# Patient Record
Sex: Female | Born: 1960 | Race: White | Hispanic: No | Marital: Married | Smoking: Never smoker
Health system: Southern US, Community
[De-identification: ages and names within clinical notes are randomized; demographics above are authoritative.]

## PROBLEM LIST (undated history)

## (undated) DIAGNOSIS — D4959 Neoplasm of unspecified behavior of other genitourinary organ: Secondary | ICD-10-CM

## (undated) DIAGNOSIS — Z862 Personal history of diseases of the blood and blood-forming organs and certain disorders involving the immune mechanism: Secondary | ICD-10-CM

## (undated) DIAGNOSIS — Z973 Presence of spectacles and contact lenses: Secondary | ICD-10-CM

## (undated) HISTORY — PX: LAPAROSCOPIC CHOLECYSTECTOMY: SUR755

---

## 1999-01-22 ENCOUNTER — Other Ambulatory Visit: Admission: RE | Admit: 1999-01-22 | Discharge: 1999-01-22 | Payer: Self-pay | Admitting: *Deleted

## 1999-06-13 ENCOUNTER — Ambulatory Visit (HOSPITAL_COMMUNITY): Admission: RE | Admit: 1999-06-13 | Discharge: 1999-06-13 | Payer: Self-pay | Admitting: *Deleted

## 1999-06-13 ENCOUNTER — Encounter: Payer: Self-pay | Admitting: *Deleted

## 2000-01-28 ENCOUNTER — Other Ambulatory Visit: Admission: RE | Admit: 2000-01-28 | Discharge: 2000-01-28 | Payer: Self-pay | Admitting: *Deleted

## 2001-03-18 ENCOUNTER — Other Ambulatory Visit: Admission: RE | Admit: 2001-03-18 | Discharge: 2001-03-18 | Payer: Self-pay | Admitting: *Deleted

## 2001-06-22 ENCOUNTER — Other Ambulatory Visit: Admission: RE | Admit: 2001-06-22 | Discharge: 2001-06-22 | Payer: Self-pay | Admitting: *Deleted

## 2001-09-09 ENCOUNTER — Observation Stay (HOSPITAL_COMMUNITY): Admission: RE | Admit: 2001-09-09 | Discharge: 2001-09-10 | Payer: Self-pay | Admitting: General Surgery

## 2001-09-09 ENCOUNTER — Encounter (INDEPENDENT_AMBULATORY_CARE_PROVIDER_SITE_OTHER): Payer: Self-pay | Admitting: *Deleted

## 2002-02-27 ENCOUNTER — Other Ambulatory Visit: Admission: RE | Admit: 2002-02-27 | Discharge: 2002-02-27 | Payer: Self-pay | Admitting: *Deleted

## 2003-03-01 ENCOUNTER — Other Ambulatory Visit: Admission: RE | Admit: 2003-03-01 | Discharge: 2003-03-01 | Payer: Self-pay | Admitting: *Deleted

## 2004-03-07 ENCOUNTER — Other Ambulatory Visit: Admission: RE | Admit: 2004-03-07 | Discharge: 2004-03-07 | Payer: Self-pay | Admitting: *Deleted

## 2004-03-11 ENCOUNTER — Ambulatory Visit (HOSPITAL_COMMUNITY): Admission: RE | Admit: 2004-03-11 | Discharge: 2004-03-11 | Payer: Self-pay | Admitting: *Deleted

## 2005-03-16 ENCOUNTER — Other Ambulatory Visit: Admission: RE | Admit: 2005-03-16 | Discharge: 2005-03-16 | Payer: Self-pay | Admitting: *Deleted

## 2006-03-19 ENCOUNTER — Other Ambulatory Visit: Admission: RE | Admit: 2006-03-19 | Discharge: 2006-03-19 | Payer: Self-pay | Admitting: Obstetrics & Gynecology

## 2007-04-12 ENCOUNTER — Other Ambulatory Visit: Admission: RE | Admit: 2007-04-12 | Discharge: 2007-04-12 | Payer: Self-pay | Admitting: Obstetrics & Gynecology

## 2008-04-12 ENCOUNTER — Other Ambulatory Visit: Admission: RE | Admit: 2008-04-12 | Discharge: 2008-04-12 | Payer: Self-pay | Admitting: Obstetrics and Gynecology

## 2011-01-02 NOTE — Op Note (Signed)
[facility]  Patient:    Nancy Carter, Nancy Carter Visit Number: [id number] MRN: [id number]          Service Type: SUR Location: [id number] Attending Physician:  Brandy Hale Dictated by:   Angelia Mould. Derrell Lolling, M.D. Proc. Date: 09/09/01 Admit Date:  09/09/2001 Discharge Date: 09/10/2001   CC:         Marinus Maw, M.D.   Operative Report  PREOPERATIVE DIAGNOSIS:  Chronic cholecystitis with cholelithiasis.  POSTOPERATIVE DIAGNOSIS:  Chronic cholecystitis with cholelithiasis.  OPERATION PERFORMED:  Laparoscopic cholecystectomy.  SURGEON:  Angelia Mould. Derrell Lolling, M.D.  FIRST ASSISTANT:  Maisie Fus B. Samuella Cota, M.D.  OPERATIVE INDICATION:  This is a 50 year old white female, who recently developed a couple episodes of severe biliary colic with right upper quadrant pain, right shoulder pain, and nausea.  Gallbladder ultrasound shows a gallbladder full of gallstones and thick-walled gallbladder.  Common bile duct is normal caliber.  Liver function tests are normal.  She is brought to the operating room electively.  OPERATIVE FINDINGS:  The gallbladder was chronically inflamed, thick-walled, and was packed with gallstones.  The anatomy of the cystic duct, cystic artery, and common bile duct were conventional.  The cystic duct was very tiny in caliber.  There was a large stone impacted at the neck of the gallbladder. The liver appeared healthy.  The stomach and duodenum, large intestine and small intestine were grossly normal to inspection.  OPERATIVE TECHNIQUE:  Following the induction of general endotracheal anesthesia, the patients abdomen was prepped and draped in a sterile fashion. Then 0.5% Marcaine with epinephrine was used as a local infiltration anesthetic.  A vertically-oriented incision was made inside the lower rim of the umbilicus.  The fascia was incised in the midline and the abdominal cavity entered under direct vision.  The 10 mm Hasson  trocar was inserted and secured with a pursestring suture of 0 Vicryl.  Pneumoperitoneum was created.  Video camera was inserted with visualization and findings as described above.  A 10 mm trocar was placed in the subxiphoid region and two 5 mm trocars placed in the right mid abdomen.  The gallbladder was elevated, placed on traction. Adhesions were taken down.  We incised the peritoneum of the lower gallbladder and carefully dissected out the cystic duct and cystic artery.  We isolated the anterior branch of the cystic artery, secured it with metal clips and divided it as we went on to the gallbladder.  We ultimately isolated the posterior branch of the cystic artery and clipped as well.  We thus created a large window behind the cystic duct, clearly identifying the junction of the cystic duct with the infundibulum of the gallbladder and identifying the region of the common bile duct.  The very tiny cystic duct was then secured with multiple metal clips and divided.  The gallbladder was dissected from its bed with electrocautery and removed through the umbilical port.  The operative field was inspected and irrigated.  There was no bleeding and no bile leak whatsoever.  The trocars were removed under direct vision, and there was no bleeding from the trocar sites.  The pneumoperitoneum was released.  The fascia at the umbilicus was closed with 0 Vicryl sutures.  The skin incisions were closed with subcuticular sutures of 4-0 Vicryl and Steri-Strips.  Clean bandages were placed and the patient taken to the recovery room in stable condition.  Estimated blood loss was about 10 cc.  Complications none. Sponge, needle, and instrument counts  were correct. Dictated by:   Angelia Mould. Derrell Lolling, M.D. Attending Physician:  Brandy Hale DD:  09/09/01 TD:  09/10/01 Job: [id number]

## 2011-04-14 ENCOUNTER — Other Ambulatory Visit: Payer: Self-pay | Admitting: Certified Nurse Midwife

## 2011-04-14 DIAGNOSIS — Z1231 Encounter for screening mammogram for malignant neoplasm of breast: Secondary | ICD-10-CM

## 2011-04-28 ENCOUNTER — Ambulatory Visit (HOSPITAL_COMMUNITY)
Admission: RE | Admit: 2011-04-28 | Discharge: 2011-04-28 | Disposition: A | Discharge status: Home / Self Care | Payer: Commercial Managed Care - PPO | Source: Ambulatory Visit | Attending: Certified Nurse Midwife | Admitting: Certified Nurse Midwife

## 2011-04-28 DIAGNOSIS — Z1231 Encounter for screening mammogram for malignant neoplasm of breast: Secondary | ICD-10-CM

## 2012-12-22 ENCOUNTER — Telehealth: Payer: Self-pay | Admitting: Certified Nurse Midwife

## 2012-12-22 NOTE — Telephone Encounter (Signed)
Needs OV to evaluate

## 2012-12-22 NOTE — Telephone Encounter (Signed)
Spoke with pt about periods. Pt was told by DL that she was peri-menopausal at last visit. Periods have been irregular for a year, but have been coming every 15-21 days since January. In March, she had a period from the 11th to the 15th, bleeding stopped for 2 days, then started back from the 18th for a week. Pt had period on April 15th that was normal. Then in May she started on the 1st, bled 2 days, stopped, then started again 2 days later. Is this to be expected, or should she be concerned?

## 2012-12-22 NOTE — Telephone Encounter (Signed)
LMTCB, recommend appointment and can see her tomm if she wants.

## 2012-12-22 NOTE — Telephone Encounter (Signed)
LEFT MESSAGE ON CB# TO RETURN CALL  FOR IRREGULAR MENSES.

## 2012-12-22 NOTE — Telephone Encounter (Signed)
Irregular menses

## 2012-12-23 NOTE — Telephone Encounter (Signed)
PATIENT NOTIFIED ON NEED TO BE SEEN BY D.LEONARD, CNM, PATIENT REQUEST TO COME IN ON MAY 13TH # 10;30am. APPT. MADE.

## 2012-12-26 ENCOUNTER — Encounter: Payer: Self-pay | Admitting: *Deleted

## 2012-12-27 ENCOUNTER — Ambulatory Visit (INDEPENDENT_AMBULATORY_CARE_PROVIDER_SITE_OTHER): Payer: Commercial Managed Care - PPO | Admitting: Certified Nurse Midwife

## 2012-12-27 ENCOUNTER — Encounter: Payer: Self-pay | Admitting: Certified Nurse Midwife

## 2012-12-27 VITALS — BP 110/78 | HR 68 | Resp 16

## 2012-12-27 DIAGNOSIS — N949 Unspecified condition associated with female genital organs and menstrual cycle: Secondary | ICD-10-CM

## 2012-12-27 DIAGNOSIS — N926 Irregular menstruation, unspecified: Secondary | ICD-10-CM

## 2012-12-27 DIAGNOSIS — N760 Acute vaginitis: Secondary | ICD-10-CM

## 2012-12-27 DIAGNOSIS — N938 Other specified abnormal uterine and vaginal bleeding: Secondary | ICD-10-CM

## 2012-12-27 MED ORDER — METRONIDAZOLE 500 MG PO TABS: 500.0000 mg | ORAL_TABLET | Freq: Two times a day (BID) | ORAL | Status: DC

## 2012-12-27 NOTE — Progress Notes (Signed)
52 yo married white female [employer] here with concern of abnormal period or bleeding.  Uses Micronor for contraception and aware of bleeding profile.  Describes periods previously as monthly with occasional spotting and period like bleeding regularly.  Has now had episodes of amenorrhea and then spotting continuously, then none.  Refer to menses calendar. No complaints of abdominal or pelvic pain.  Uses Micronor due to hypertension with combination OCP. No change in vaginal discharge that she is aware of. Concerned. Patient had visit with PCP recently with all labs done, including TSH, all normal per patient/(RN)  O: 04-28-12 normal with negative pap with HPVHR not detected POCT Hgb. 13.2 Healthy female, WD,WN Affect: Normal, orientation X 3 Skin: warm and dry Pelvic Exam: External genital area normal no lesions BUS: negative Vagina: Dark brown discharge noted, wet prep taken Cervix: Non tender, normal Uterus: Firm, slight nodular feel posterior, non tender: Adnexa: non tender, no masses Perineal: no lesions  Wet Prep: clue cells positive  A:Essentially normal pelvic exam Uterus nodular feel ? Fibroid Wet prep BV Contraception Micronor, Hypertension with OCP  P: Reviewed findings and discussed fibroid? Benign finding, but can change bleeding profile, along with perimenopausal changes. Recommend PUS if Dr Frederik Schmidt advise patient Reviewed findings: Rx Flagyl see order Discussed normal bleeding profile with Micronor, will continue to keep menses record.  Questions addressed.  Rv as above, prn Reviewed, TL

## 2013-01-02 ENCOUNTER — Other Ambulatory Visit: Payer: Self-pay | Admitting: Emergency Medicine

## 2013-01-03 ENCOUNTER — Telehealth: Payer: Self-pay | Admitting: Certified Nurse Midwife

## 2013-01-03 NOTE — Telephone Encounter (Signed)
LMTCB to discuss insurance benefits and schedule PUS.  °

## 2013-01-18 ENCOUNTER — Encounter: Payer: Self-pay | Admitting: Certified Nurse Midwife

## 2013-02-08 ENCOUNTER — Telehealth: Payer: Self-pay | Admitting: Gynecology

## 2013-02-08 NOTE — Telephone Encounter (Signed)
LMTCB on cell phone to reschedule pus scheduled for 6/30 with [place] since Farrel Gobble will not be here.

## 2013-02-09 ENCOUNTER — Other Ambulatory Visit: Payer: Self-pay | Admitting: Obstetrics & Gynecology

## 2013-02-09 DIAGNOSIS — N939 Abnormal uterine and vaginal bleeding, unspecified: Secondary | ICD-10-CM

## 2013-02-09 DIAGNOSIS — N852 Hypertrophy of uterus: Secondary | ICD-10-CM

## 2013-02-13 ENCOUNTER — Ambulatory Visit (INDEPENDENT_AMBULATORY_CARE_PROVIDER_SITE_OTHER): Payer: Commercial Managed Care - PPO

## 2013-02-13 ENCOUNTER — Encounter: Payer: Self-pay | Admitting: Obstetrics & Gynecology

## 2013-02-13 ENCOUNTER — Ambulatory Visit (INDEPENDENT_AMBULATORY_CARE_PROVIDER_SITE_OTHER): Payer: Commercial Managed Care - PPO | Admitting: Obstetrics & Gynecology

## 2013-02-13 ENCOUNTER — Other Ambulatory Visit: Payer: Self-pay | Admitting: Obstetrics & Gynecology

## 2013-02-13 DIAGNOSIS — N939 Abnormal uterine and vaginal bleeding, unspecified: Secondary | ICD-10-CM

## 2013-02-13 DIAGNOSIS — N840 Polyp of corpus uteri: Secondary | ICD-10-CM

## 2013-02-13 DIAGNOSIS — N926 Irregular menstruation, unspecified: Secondary | ICD-10-CM

## 2013-02-13 DIAGNOSIS — N838 Other noninflammatory disorders of ovary, fallopian tube and broad ligament: Secondary | ICD-10-CM

## 2013-02-13 DIAGNOSIS — D259 Leiomyoma of uterus, unspecified: Secondary | ICD-10-CM

## 2013-02-13 DIAGNOSIS — N839 Noninflammatory disorder of ovary, fallopian tube and broad ligament, unspecified: Secondary | ICD-10-CM

## 2013-02-13 DIAGNOSIS — D251 Intramural leiomyoma of uterus: Secondary | ICD-10-CM

## 2013-02-13 DIAGNOSIS — N852 Hypertrophy of uterus: Secondary | ICD-10-CM

## 2013-02-13 DIAGNOSIS — N831 Corpus luteum cyst of ovary, unspecified side: Secondary | ICD-10-CM

## 2013-02-13 DIAGNOSIS — D4959 Neoplasm of unspecified behavior of other genitourinary organ: Secondary | ICD-10-CM

## 2013-02-13 DIAGNOSIS — N8300 Follicular cyst of ovary, unspecified side: Secondary | ICD-10-CM

## 2013-02-13 NOTE — Patient Instructions (Signed)
We will call with lab results later this week.

## 2013-02-13 NOTE — Progress Notes (Signed)
51 y.o.G0 MWF here for a pelvic ultrasound with sonohystogram. H/O enlarged uterus on exam.  During ultrasound, thickened endometrium noted.  Patient has been experiencing intermenstrual bleeding.    Indication: Enlarged uterus, intermenstrual bleeding Contraception:  OCP  Technique: Both transabdominal and transvaginal ultrasound examinations of the pelvis were performed. Transabdominal technique was performed for global imaging of the pelvis including uterus, ovaries, adnexal regions, and pelvic cul-de-sac. It was necessary to proceed with endovaginal exam following the abdominal ultrasound  transabdominal exam to visualize the endometrium and adnexa. Color and duplex Doppler ultrasound was utilized to evaluate blood flow to the ovaries.   FINDINGS: (report below)  UTERUS: 8.2 x 6.6 x 5.5cm with 3.3 and 1.2cm intramural fibroids EMS: 5.69mm  ADNEXA: left 3.9 x 2.7 x 1.7cm with 17mm simple appearing cyst Right 4.5 x 3.2 x 2.2cm with 20mm reticular appearing/avascular area, solid 22mm area at inferior portion of ovary and 23mm smooth ovarian cyst between these two findings CUL DE SAC: neg   SHSG: After obtaining appropriate verbal consent from patient, the cervix was visualized using a speculum, and prepped with betadine. A tenaculum was applied to the cervix. Dilation of the cervix was not necessary. The catheter was passed into the uterus and sterile saline introduced, with the following findings: 26 x 5mm probable polyp   Findings discussed with patient. Recommend removal of polyp with probable follow-up of ultrasound to ensure ovary looks like.  I feel can wait to proceed with hysteroscopic polyp resection and recheck ovary in about six weeks.  If findings are all functional, then changes will be seen by that time.  If not change, may decide to proceed with RSO.    Hysteroscopic polyp resection discussed with patient. Recovery and pain management discussed. Risks discussed including but not  limited to bleeding, rare risk of transfusion, infection, 1% risk of uterine perforation with risks of fluid deficit causing cardiac arrythmia, cerebral swelling and/or need to stop procedure early. Fluid emboli and rare risk of death discussed. DVT/PE, rare risk of risk of bowel/bladder/ureteral/vascular injury. Patient aware if pathology abnormal she may need additional treatment. All questions answered.   Assessment: AUB with mid cycle spotting, endometrial polyp, uterine fibroids, ovarian mass  Plan: Ca-125 today. If normal, plan repeat PUS in 6 weeks to recheck ovary.  If no change or larger, will remove ovary.  If smaller, will proceed with polyp removal only.   ~30 minutes spent with patient >50% of time was in face to face discussion of above.

## 2013-02-28 NOTE — Telephone Encounter (Signed)
Pt is not sure if Dr Hyacinth Meeker want to have a repeat ultrasound. Would like nurse to check her chart and call her.

## 2013-03-01 ENCOUNTER — Telehealth: Payer: Self-pay | Admitting: *Deleted

## 2013-03-01 ENCOUNTER — Encounter: Payer: Self-pay | Admitting: Certified Nurse Midwife

## 2013-03-01 DIAGNOSIS — N838 Other noninflammatory disorders of ovary, fallopian tube and broad ligament: Secondary | ICD-10-CM

## 2013-03-01 NOTE — Telephone Encounter (Signed)
See next message with result note.

## 2013-03-01 NOTE — Progress Notes (Signed)
See telephone note form 03-01-13. Patient is aware of result.

## 2013-03-01 NOTE — Telephone Encounter (Signed)
Message copied by Alisa Graff on Wed Mar 01, 2013 12:12 PM ------      Message from: Jerene Bears      Created: Wed Mar 01, 2013 10:09 AM       Result i just sent to you should go to [employer]. ------

## 2013-03-01 NOTE — Telephone Encounter (Signed)
Patient states she has received message about normal CA125. Advised dr Hyacinth Meeker recommends we proceed with hysteroscopic polyp resection.  Will also need PUS in 6 months to recheck ovary.  Patient agreeable to all.  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  Agrees to first available.  Will schedule and call her back.

## 2013-03-13 ENCOUNTER — Telehealth: Payer: Self-pay | Admitting: Obstetrics & Gynecology

## 2013-03-13 NOTE — Telephone Encounter (Signed)
Spoke with patient about her insurance benefits or surgery. Patient agreed to the amount and wants to proceed with scheduling. Patient also aware that she has a balance in Epic.

## 2013-03-28 ENCOUNTER — Telehealth: Payer: Self-pay | Admitting: *Deleted

## 2013-03-28 NOTE — Telephone Encounter (Signed)
Call to patient to advise of surgery scheduled for 04-10-13 at 1000 at [facility]. Surgery instructions given and copy mailed.  Post op appointment scheduled. Call prn.

## 2013-03-29 ENCOUNTER — Encounter (HOSPITAL_COMMUNITY): Payer: Self-pay | Admitting: *Deleted

## 2013-03-29 ENCOUNTER — Telehealth: Payer: Self-pay | Admitting: *Deleted

## 2013-03-29 NOTE — Telephone Encounter (Signed)
Patient came in to office to pay surgerical pre-payment.  Dropped off FMLA forms for completion.  Patient is an Charity fundraiser for [employer].  Usually do not complete FMLA forms for 3 days or less.  How much time is she out of work.  Patient did not mention this to me previously.

## 2013-03-30 NOTE — Telephone Encounter (Signed)
Happy to do FMLA papers for her but usually not necessary for only being out of work for 2-3 days, unless she has no PAL and then it would need to be done.  If you'll bring it to me, I'll do it.  Thanks.

## 2013-04-03 NOTE — Telephone Encounter (Signed)
Patient states she needs FMLA foorms even though just three days off to keep it from "counting against her" since this was not pre-approved PAL.  Requests to pick up forms when completed.

## 2013-04-04 NOTE — Telephone Encounter (Signed)
Forms completed and sent to Dr Hyacinth Meeker for review/signature.

## 2013-04-05 ENCOUNTER — Encounter (HOSPITAL_COMMUNITY): Payer: Self-pay | Admitting: Pharmacy Technician

## 2013-04-07 ENCOUNTER — Telehealth: Payer: Self-pay | Admitting: *Deleted

## 2013-04-07 NOTE — Telephone Encounter (Signed)
Completed FMLA forms received form Dr Hyacinth Meeker.  Call to patient to notify, LMTCB.

## 2013-04-07 NOTE — Telephone Encounter (Signed)
Patient returned call and she is notified that forms are ready.  She will have husband pick up this pm.  Scheduled 6 month follow up PUS for 10-05-13 at 2pm and the consult Dr Hyacinth Meeker at 230pm

## 2013-04-09 MED ORDER — DEXTROSE 5 % IV SOLN
2.0000 g | INTRAVENOUS | Status: AC
  Administered 2013-04-10: 2 g via INTRAVENOUS
  Filled 2013-04-09: qty 2

## 2013-04-10 ENCOUNTER — Encounter (HOSPITAL_COMMUNITY)
Admission: RE | Disposition: A | Discharge status: Home / Self Care | Payer: Self-pay | Source: Ambulatory Visit | Attending: Obstetrics & Gynecology

## 2013-04-10 ENCOUNTER — Ambulatory Visit (HOSPITAL_COMMUNITY)
Admission: RE | Admit: 2013-04-10 | Discharge: 2013-04-10 | Disposition: A | Discharge status: Home / Self Care | Payer: 59 | Source: Ambulatory Visit | Attending: Obstetrics & Gynecology | Admitting: Obstetrics & Gynecology

## 2013-04-10 ENCOUNTER — Ambulatory Visit (HOSPITAL_COMMUNITY): Payer: 59 | Admitting: Anesthesiology

## 2013-04-10 ENCOUNTER — Encounter (HOSPITAL_COMMUNITY): Payer: Self-pay | Admitting: Anesthesiology

## 2013-04-10 ENCOUNTER — Encounter (HOSPITAL_COMMUNITY): Payer: Self-pay | Admitting: *Deleted

## 2013-04-10 DIAGNOSIS — N949 Unspecified condition associated with female genital organs and menstrual cycle: Secondary | ICD-10-CM | POA: Insufficient documentation

## 2013-04-10 DIAGNOSIS — D250 Submucous leiomyoma of uterus: Secondary | ICD-10-CM

## 2013-04-10 DIAGNOSIS — D251 Intramural leiomyoma of uterus: Secondary | ICD-10-CM | POA: Insufficient documentation

## 2013-04-10 DIAGNOSIS — N938 Other specified abnormal uterine and vaginal bleeding: Secondary | ICD-10-CM | POA: Insufficient documentation

## 2013-04-10 DIAGNOSIS — N926 Irregular menstruation, unspecified: Secondary | ICD-10-CM | POA: Insufficient documentation

## 2013-04-10 DIAGNOSIS — N840 Polyp of corpus uteri: Secondary | ICD-10-CM

## 2013-04-10 DIAGNOSIS — I10 Essential (primary) hypertension: Secondary | ICD-10-CM | POA: Insufficient documentation

## 2013-04-10 HISTORY — PX: DILATATION & CURRETTAGE/HYSTEROSCOPY WITH RESECTOCOPE: SHX5572

## 2013-04-10 LAB — CBC
HCT: 41.3 % (ref 36.0–46.0)
Hemoglobin: 13.7 g/dL (ref 12.0–15.0)
RBC: 4.47 MIL/uL (ref 3.87–5.11)
WBC: 6.5 10*3/uL (ref 4.0–10.5)

## 2013-04-10 LAB — PREGNANCY, URINE: Preg Test, Ur: NEGATIVE

## 2013-04-10 SURGERY — DILATATION & CURETTAGE/HYSTEROSCOPY WITH RESECTOCOPE
Anesthesia: General | Site: Vagina | Wound class: Clean Contaminated

## 2013-04-10 MED ORDER — LACTATED RINGERS IV SOLN
INTRAVENOUS | Status: DC
  Administered 2013-04-10: 10:00:00 via INTRAVENOUS

## 2013-04-10 MED ORDER — ONDANSETRON HCL 4 MG/2ML IJ SOLN
INTRAMUSCULAR | Status: AC
  Filled 2013-04-10: qty 2

## 2013-04-10 MED ORDER — METOCLOPRAMIDE HCL 5 MG/ML IJ SOLN: 10.0000 mg | Freq: Once | INTRAMUSCULAR | Status: DC | PRN

## 2013-04-10 MED ORDER — HYDROCODONE-ACETAMINOPHEN 5-325 MG PO TABS: 1.0000 | ORAL_TABLET | Freq: Four times a day (QID) | ORAL | Status: DC | PRN

## 2013-04-10 MED ORDER — DEXAMETHASONE SODIUM PHOSPHATE 10 MG/ML IJ SOLN
INTRAMUSCULAR | Status: AC
  Filled 2013-04-10: qty 1

## 2013-04-10 MED ORDER — KETOROLAC TROMETHAMINE 30 MG/ML IJ SOLN
INTRAMUSCULAR | Status: DC | PRN
  Administered 2013-04-10: 30 mg via INTRAVENOUS

## 2013-04-10 MED ORDER — FENTANYL CITRATE 0.05 MG/ML IJ SOLN: 25.0000 ug | INTRAMUSCULAR | Status: DC | PRN

## 2013-04-10 MED ORDER — PROPOFOL 10 MG/ML IV EMUL
INTRAVENOUS | Status: AC
  Filled 2013-04-10: qty 20

## 2013-04-10 MED ORDER — MEPERIDINE HCL 25 MG/ML IJ SOLN: 6.2500 mg | INTRAMUSCULAR | Status: DC | PRN

## 2013-04-10 MED ORDER — GLYCINE 1.5 % IR SOLN
Status: DC | PRN
  Administered 2013-04-10: 3000 mL

## 2013-04-10 MED ORDER — KETOROLAC TROMETHAMINE 60 MG/2ML IM SOLN
INTRAMUSCULAR | Status: DC | PRN
  Administered 2013-04-10: 30 mg via INTRAMUSCULAR

## 2013-04-10 MED ORDER — PROPOFOL 10 MG/ML IV BOLUS
INTRAVENOUS | Status: DC | PRN
  Administered 2013-04-10: 200 mg via INTRAVENOUS

## 2013-04-10 MED ORDER — FENTANYL CITRATE 0.05 MG/ML IJ SOLN
INTRAMUSCULAR | Status: AC
  Filled 2013-04-10: qty 2

## 2013-04-10 MED ORDER — FENTANYL CITRATE 0.05 MG/ML IJ SOLN
INTRAMUSCULAR | Status: DC | PRN
  Administered 2013-04-10: 100 ug via INTRAVENOUS

## 2013-04-10 MED ORDER — LIDOCAINE HCL (CARDIAC) 20 MG/ML IV SOLN
INTRAVENOUS | Status: DC | PRN
  Administered 2013-04-10: 80 mg via INTRAVENOUS

## 2013-04-10 MED ORDER — LIDOCAINE-EPINEPHRINE 1 %-1:100000 IJ SOLN
INTRAMUSCULAR | Status: DC | PRN
  Administered 2013-04-10: 10 mL

## 2013-04-10 MED ORDER — MIDAZOLAM HCL 2 MG/2ML IJ SOLN
INTRAMUSCULAR | Status: AC
  Filled 2013-04-10: qty 2

## 2013-04-10 MED ORDER — LIDOCAINE HCL (CARDIAC) 20 MG/ML IV SOLN
INTRAVENOUS | Status: AC
  Filled 2013-04-10: qty 5

## 2013-04-10 MED ORDER — ONDANSETRON HCL 4 MG/2ML IJ SOLN
INTRAMUSCULAR | Status: DC | PRN
  Administered 2013-04-10: 4 mg via INTRAVENOUS

## 2013-04-10 MED ORDER — MIDAZOLAM HCL 5 MG/5ML IJ SOLN
INTRAMUSCULAR | Status: DC | PRN
  Administered 2013-04-10: 2 mg via INTRAVENOUS

## 2013-04-10 MED ORDER — DEXAMETHASONE SODIUM PHOSPHATE 4 MG/ML IJ SOLN
INTRAMUSCULAR | Status: DC | PRN
  Administered 2013-04-10: 10 mg via INTRAVENOUS

## 2013-04-10 SURGICAL SUPPLY — 17 items
CANISTER SUCTION 2500CC (MISCELLANEOUS) ×2 IMPLANT
CATH ROBINSON RED A/P 16FR (CATHETERS) ×2 IMPLANT
CLOTH BEACON ORANGE TIMEOUT ST (SAFETY) ×2 IMPLANT
CONTAINER PREFILL 10% NBF 60ML (FORM) ×4 IMPLANT
DILATOR CANAL MILEX (MISCELLANEOUS) IMPLANT
DRESSING TELFA 8X3 (GAUZE/BANDAGES/DRESSINGS) ×2 IMPLANT
ELECT REM PT RETURN 9FT ADLT (ELECTROSURGICAL) ×2
ELECTRODE REM PT RTRN 9FT ADLT (ELECTROSURGICAL) ×1 IMPLANT
GLOVE BIOGEL PI IND STRL 7.0 (GLOVE) ×1 IMPLANT
GLOVE BIOGEL PI INDICATOR 7.0 (GLOVE) ×1
GLOVE ECLIPSE 6.5 STRL STRAW (GLOVE) ×4 IMPLANT
GOWN STRL REIN XL XLG (GOWN DISPOSABLE) ×4 IMPLANT
LOOP ANGLED CUTTING 22FR (CUTTING LOOP) ×2 IMPLANT
PACK HYSTEROSCOPY LF (CUSTOM PROCEDURE TRAY) ×2 IMPLANT
PAD OB MATERNITY 4.3X12.25 (PERSONAL CARE ITEMS) ×2 IMPLANT
TOWEL OR 17X24 6PK STRL BLUE (TOWEL DISPOSABLE) ×4 IMPLANT
WATER STERILE IRR 1000ML POUR (IV SOLUTION) ×2 IMPLANT

## 2013-04-10 NOTE — H&P (Signed)
Nancy Carter is an 52 y.o. femaleG0 MWF here for hysteroscopic polyp removal and D&C due to 2.5cm endometrial polyp and DUB.  She was evaluated pre-operatively with SHG, in office, showing 2 intramural fibroids and endometrial polyp.  She is on micronor for [facility] and does understand this procedure today may not fully resolve problem.  Risks, benefits, procedure discussed and documented in prior note.  No questions today.  Pertinent Gynecological History: Menses: irregular with significant amount of spotting Bleeding: dysfunctional uterine bleeding Contraception: oral progesterone-only contraceptive DES exposure: denies Blood transfusions: none Sexually transmitted diseases: no past history Previous GYN Procedures: none  Last mammogram: normal Date: 9/12 Last pap: normal Date: 9/13--nl pap with neg HR HPV OB History: G0, P0   Menstrual History: Patient's last menstrual period was 03/20/2013.    Past Medical History  Diagnosis Date  . 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 Hypertension     pt not taking medications at this time  . Anemia     Past Surgical History  Procedure Laterality Date  . Laparoscopic cholecystectomy      Family History  Problem Relation Age of Onset  . Diabetes Maternal Grandmother   . Hypertension Maternal Grandfather   . Hypertension Paternal Grandfather   . Diabetes Mother   . Hypertension Mother   . Hypertension Brother     Social History:  reports that she has never smoked. She does not have any smokeless tobacco history on file. She reports that she does not drink alcohol or use illicit drugs.  Allergies: No Known Allergies  Prescriptions prior to admission  Medication Sig Dispense Refill  . aspirin 81 MG tablet Take 81 mg by mouth daily.      . cholecalciferol (VITAMIN D) 1000 UNITS tablet Take 1,000-2,000 Units by mouth See admin instructions. Takes 2000 iu on Monday, Wednesday, Fridays and 1000 iu on tuesdays and  Thursdays      . norethindrone (MICRONOR,CAMILA,ERRIN) 0.35 MG tablet Take 1 tablet by mouth daily.        Review of Systems  All other systems reviewed and are negative.    Blood pressure 140/72, pulse 58, temperature 98.2 F (36.8 C), temperature source Oral, resp. rate 18, height 5\' 2"  (1.575 m), weight 138 lb (62.596 kg), last menstrual period 03/20/2013, SpO2 100.00%. Physical Exam  Constitutional: She is oriented to person, place, and time. She appears well-developed and well-nourished.  HENT:  Head: Normocephalic and atraumatic.  Neck: Normal range of motion.  Cardiovascular: Normal rate and regular rhythm.   Respiratory: Effort normal.  GI: Soft. Bowel sounds are normal.  Neurological: She is alert and oriented to person, place, and time.  Skin: Skin is warm and dry.  Psychiatric: She has a normal mood and affect.    Results for orders placed during the hospital encounter of 04/10/13 (from the past 24 hour(s))  CBC     Status: None   Collection Time    04/10/13  8:50 AM      Result Value Range   WBC 6.5  4.0 - 10.5 K/uL   RBC 4.47  3.87 - 5.11 MIL/uL   Hemoglobin 13.7  12.0 - 15.0 g/dL   HCT 45.4  09.8 - 11.9 %   MCV 92.4  78.0 - 100.0 fL   MCH 30.6  26.0 - 34.0 pg   MCHC 33.2  30.0 - 36.0 g/dL   RDW 14.7  82.9 - 56.2 %   Platelets 200  150 - 400 K/uL    No results found.  Assessment/Plan: 52 yo G0 MWF with DUB and endometrial polyp here for hysteroscopic polyp resection, D&C.  All questions answered.  Pt here and ready to proceed.  [name] 04/10/2013, 9:02 AM

## 2013-04-10 NOTE — Anesthesia Postprocedure Evaluation (Signed)
  Anesthesia Post-op Note  Patient: Nancy Carter  Procedure(s) Performed: Procedure(s): DILATATION & CURETTAGE/HYSTEROSCOPY WITH RESECTOCOPE (N/A)  Patient Location: PACU  Anesthesia Type:General  Level of Consciousness: awake, alert  and oriented  Airway and Oxygen Therapy: Patient Spontanous Breathing  Post-op Pain: none  Post-op Assessment: Post-op Vital signs reviewed, Patient's Cardiovascular Status Stable, Respiratory Function Stable, Patent Airway, No signs of Nausea or vomiting and Pain level controlled  Post-op Vital Signs: Reviewed and stable  Complications: No apparent anesthesia complications

## 2013-04-10 NOTE — Op Note (Signed)
04/10/2013  10:47 AM  PATIENT:  Nancy Carter  52 y.o. female  PRE-OPERATIVE DIAGNOSIS:  DUB, Endometrial polyp CPT [phone]  POST-OPERATIVE DIAGNOSIS:  DUB, endometrial polyp  PROCEDURE:  Procedure(s): DILATATION & CURETTAGE/HYSTEROSCOPY WITH RESECTOCOPE  SURGEON:  Terrionna Bridwell SUZANNE  ASSISTANTS: OR staff   ANESTHESIA:   general  ESTIMATED BLOOD LOSS: 10cc  BLOOD ADMINISTERED:none   FLUIDS: 600cc LR  UOP: 200 cc clear, drained with I&O cath at beginning of procedure  SPECIMEN:  Endometrial curettings and 2 endometrial polyps.  These may be small submucosal fibroids as they are very rubbery feeling.  DISPOSITION OF SPECIMEN:  PATHOLOGY  FINDINGS: two endometrial polyps vs submucosal fibroids, thin endometrium  DESCRIPTION OF OPERATION: Patient was taken to the operating room.  She is placed in the supine position. SCDs were on her lower extremities and functioning properly. General anesthesia with an LMA was administered without difficulty. Dr. Malen Gauze oversaw case.  Legs were then placed in the [facility] stirrups in the low lithotomy position. The legs were lifted to the high lithotomy position and the Betadine prep was used on the inner thighs perineum and vagina x3. Patient was draped in a normal standard fashion. An in and out catheterization with a red rubber Foley catheter was performed. Approximately 200 cc of clear urine was noted. A heavy weighted speculum was placed the vagina. The anterior lip of the cervix was grasped with single-tooth tenaculum.  A paracervical block of 1% lidocaine mixed one-to-one with epinephrine (1:100,000 units).  10 cc was used total. The cervix is dilated up to #21 with Shawnie Pons dilators. The endometrial cavity sounded to 6cm.   A 2.9 millimeter diagnostic hysteroscope was obtained. 1.5% glycine was used as a hysteroscopic fluid. The hysteroscope was advanced through the endocervical canal into the endometrial cavity. The tubal ostia were noted  bilaterally.  There was 2 polyps/possible fibroids noted instead of one long polyp (which is what I thought was present the with [facility] done in-office).  A polyp forcep was used to try and grasp these.  The first one was obtained but the second one was all the way at the top of the endometrial cavity.  After several attempts, the cervix was dilated up to a #31 with Shawnie Pons dilators.  The resectoscope was obtained and it was being assembled, I attempted to grasp the fundal lesion with the polyp forcep and was successful.  The resectoscope was advanced through the cervix and the lesions were fully removed.  Photo was taken.  The hystereoscope was removed and using a #1 toothed curette, the endometrial cavity was curetted until a rough, gritty texture was noted in all quadrants.  The patient tolerated the procedure well.  The fluid deficit was 125 cc, inclusive of fluid that was on the floor. The tenaculum was removed from the anterior lip of the cervix. The speculum was removed from the vagina. The prep was cleansed of the patient's skin. The legs are positioned back in the supine position. Sponge, lap, needle, initially counts were correct x2. Patient was taken to recovery in stable condition.  COUNTS:  YES  PLAN OF CARE: Transfer to PACU                 '

## 2013-04-10 NOTE — Anesthesia Preprocedure Evaluation (Signed)
Anesthesia Evaluation  Patient identified by MRN, date of birth, ID band Patient awake    Reviewed: Allergy & Precautions, H&P , NPO status , Patient's Chart, lab work & pertinent test results  Airway Mallampati: II TM Distance: >3 FB Neck ROM: Full    Dental no notable dental hx. (+) Teeth Intact   Pulmonary neg pulmonary ROS,  breath sounds clear to auscultation  Pulmonary exam normal       Cardiovascular hypertension, Rhythm:Regular Rate:Normal     Neuro/Psych negative neurological ROS  negative psych ROS   GI/Hepatic negative GI ROS, Neg liver ROS,   Endo/Other  negative endocrine ROS  Renal/GU negative Renal ROS  negative genitourinary   Musculoskeletal negative musculoskeletal ROS (+)   Abdominal   Peds  Hematology negative hematology ROS (+)   Anesthesia Other Findings   Reproductive/Obstetrics Endometrial Polyp                           Anesthesia Physical Anesthesia Plan  ASA: II  Anesthesia Plan: General   Post-op Pain Management:    Induction: Intravenous  Airway Management Planned: LMA  Additional Equipment:   Intra-op Plan:   Post-operative Plan: Extubation in OR  Informed Consent: I have reviewed the patients History and Physical, chart, labs and discussed the procedure including the risks, benefits and alternatives for the proposed anesthesia with the patient or authorized representative who has indicated his/her understanding and acceptance.   Dental advisory given  Plan Discussed with: CRNA, Anesthesiologist and Surgeon  Anesthesia Plan Comments:         Anesthesia Quick Evaluation

## 2013-04-10 NOTE — Anesthesia Procedure Notes (Signed)
Procedure Name: LMA Insertion Date/Time: 04/10/2013 9:59 AM Performed by: Graciela Husbands Pre-anesthesia Checklist: Patient identified, Timeout performed, Emergency Drugs available, Suction available and Patient being monitored Patient Re-evaluated:Patient Re-evaluated prior to inductionOxygen Delivery Method: Circle system utilized Preoxygenation: Pre-oxygenation with 100% oxygen Intubation Type: IV induction Ventilation: Mask ventilation without difficulty LMA: LMA inserted LMA Size: 4.0 Number of attempts: 1 Placement Confirmation: breath sounds checked- equal and bilateral and positive ETCO2 Tube secured with: Tape Dental Injury: Teeth and Oropharynx as per pre-operative assessment  Difficulty Due To: Difficult Airway- due to limited oral opening

## 2013-04-10 NOTE — Transfer of Care (Signed)
Immediate Anesthesia Transfer of Care Note  Patient: Nancy Carter  Procedure(s) Performed: Procedure(s): DILATATION & CURETTAGE/HYSTEROSCOPY WITH RESECTOCOPE (N/A)  Patient Location: PACU  Anesthesia Type:General  Level of Consciousness: awake, alert  and oriented  Airway & Oxygen Therapy: Patient Spontanous Breathing and Patient connected to nasal cannula oxygen  Post-op Assessment: Report given to PACU RN and Post -op Vital signs reviewed and stable  Post vital signs: Reviewed and stable  Complications: No apparent anesthesia complications

## 2013-04-11 ENCOUNTER — Encounter (HOSPITAL_COMMUNITY): Payer: Self-pay | Admitting: Obstetrics & Gynecology

## 2013-04-13 ENCOUNTER — Telehealth: Payer: Self-pay | Admitting: *Deleted

## 2013-04-13 NOTE — Telephone Encounter (Signed)
Call to patient. Per Dr Hyacinth Meeker, wants to do follow up pelvic ultrasound in 4 months (last done on 02-13-13). LMTCB.

## 2013-04-14 ENCOUNTER — Telehealth: Payer: Self-pay | Admitting: *Deleted

## 2013-04-14 NOTE — Telephone Encounter (Signed)
LMTCB

## 2013-04-14 NOTE — Telephone Encounter (Signed)
Message copied by Alisa Graff on Fri Apr 14, 2013  9:29 AM ------      Message from: Jerene Bears      Created: Tue Apr 11, 2013  3:18 PM       Left msg for pt.  Pathology showed fibroids, not polyps.  I told her I expected this yesterday.  All benign.  I want to change her U/S to 4 months instead of six as I want to see her back in 3-4 months and I just want to do it all on one day. ------

## 2013-04-18 ENCOUNTER — Telehealth: Payer: Self-pay | Admitting: *Deleted

## 2013-04-18 NOTE — Telephone Encounter (Signed)
Message copied by Alisa Graff on Tue Apr 18, 2013  3:30 PM ------      Message from: Jerene Bears      Created: Tue Apr 11, 2013  3:18 PM       Left msg for pt.  Pathology showed fibroids, not polyps.  I told her I expected this yesterday.  All benign.  I want to change her U/S to 4 months instead of six as I want to see her back in 3-4 months and I just want to do it all on one day. ------

## 2013-04-18 NOTE — Progress Notes (Signed)
PUS moved up to 06-29-13.

## 2013-04-24 ENCOUNTER — Encounter: Payer: Self-pay | Admitting: Obstetrics & Gynecology

## 2013-04-24 ENCOUNTER — Ambulatory Visit (INDEPENDENT_AMBULATORY_CARE_PROVIDER_SITE_OTHER): Payer: Commercial Managed Care - PPO | Admitting: Obstetrics & Gynecology

## 2013-04-24 VITALS — BP 158/90 | HR 60 | Resp 16 | Ht 62.5 in | Wt 136.4 lb

## 2013-04-24 DIAGNOSIS — D250 Submucous leiomyoma of uterus: Secondary | ICD-10-CM

## 2013-04-24 DIAGNOSIS — D251 Intramural leiomyoma of uterus: Secondary | ICD-10-CM

## 2013-04-24 NOTE — Patient Instructions (Signed)
Please call if you have any new problems/issues 

## 2013-04-24 NOTE — Progress Notes (Signed)
Post Operative Visit  Procedure: D&C, hysteroscopy Days Post-op: 15  Subjective: Doing really well.  No pain or cramping. No spotting.  Bowel and bladder function normal.  Reviewed images and pathology with patient.  Discussed with patient keeping a diary for bleeding.  Pt knows mmg is due.  AEX for 9/15 cancelled.  Will plan AEX after pt comes for ultrasound in November.  If ovary is normal, plan AEX around May.  Objective: BP 158/90  Pulse 60  Resp 16  Ht 5' 2.5" (1.588 m)  Wt 136 lb 6.4 oz (61.871 kg)  BMI 24.54 kg/m2  LMP 03/20/2013  EXAM General: alert and cooperative Extremities: extremities normal, atraumatic, no cyanosis or edema Vaginal Bleeding: none Gyn:  Vaginal nl, cervix closed, no CMT  Assessment: s/p hysteroscopy, fibriod resection, D&C  Plan: Pt will return to repeat PUS to assess ovary and to recheck bleeding.  Pt is to call with any changes/problems before that time.

## 2013-05-01 ENCOUNTER — Ambulatory Visit: Payer: Self-pay | Admitting: Certified Nurse Midwife

## 2013-05-01 ENCOUNTER — Other Ambulatory Visit: Payer: Self-pay | Admitting: Obstetrics & Gynecology

## 2013-05-01 DIAGNOSIS — Z1231 Encounter for screening mammogram for malignant neoplasm of breast: Secondary | ICD-10-CM

## 2013-05-04 ENCOUNTER — Ambulatory Visit (HOSPITAL_COMMUNITY)
Admission: RE | Admit: 2013-05-04 | Discharge: 2013-05-04 | Disposition: A | Discharge status: Home / Self Care | Payer: 59 | Source: Ambulatory Visit | Attending: Obstetrics & Gynecology | Admitting: Obstetrics & Gynecology

## 2013-05-04 DIAGNOSIS — Z1231 Encounter for screening mammogram for malignant neoplasm of breast: Secondary | ICD-10-CM | POA: Insufficient documentation

## 2013-05-09 ENCOUNTER — Other Ambulatory Visit: Payer: Self-pay | Admitting: Obstetrics & Gynecology

## 2013-05-09 DIAGNOSIS — R928 Other abnormal and inconclusive findings on diagnostic imaging of breast: Secondary | ICD-10-CM

## 2013-05-19 ENCOUNTER — Ambulatory Visit
Admission: RE | Admit: 2013-05-19 | Discharge: 2013-05-19 | Disposition: A | Discharge status: Home / Self Care | Payer: 59 | Source: Ambulatory Visit | Attending: Obstetrics & Gynecology | Admitting: Obstetrics & Gynecology

## 2013-05-19 ENCOUNTER — Other Ambulatory Visit: Payer: Self-pay | Admitting: Obstetrics & Gynecology

## 2013-05-19 DIAGNOSIS — R928 Other abnormal and inconclusive findings on diagnostic imaging of breast: Secondary | ICD-10-CM

## 2013-06-12 ENCOUNTER — Encounter: Payer: Self-pay | Admitting: Internal Medicine

## 2013-06-13 ENCOUNTER — Telehealth: Payer: Self-pay | Admitting: Internal Medicine

## 2013-06-13 NOTE — Telephone Encounter (Signed)
Recd records from [facility], Forwarding 9pgs to Dr.Pyrtle

## 2013-06-22 ENCOUNTER — Other Ambulatory Visit: Payer: Self-pay

## 2013-06-29 ENCOUNTER — Ambulatory Visit (INDEPENDENT_AMBULATORY_CARE_PROVIDER_SITE_OTHER): Payer: Commercial Managed Care - PPO | Admitting: Obstetrics & Gynecology

## 2013-06-29 ENCOUNTER — Ambulatory Visit (INDEPENDENT_AMBULATORY_CARE_PROVIDER_SITE_OTHER): Payer: Commercial Managed Care - PPO

## 2013-06-29 VITALS — BP 124/82 | Ht 62.5 in | Wt 138.0 lb

## 2013-06-29 DIAGNOSIS — D259 Leiomyoma of uterus, unspecified: Secondary | ICD-10-CM

## 2013-06-29 DIAGNOSIS — N838 Other noninflammatory disorders of ovary, fallopian tube and broad ligament: Secondary | ICD-10-CM

## 2013-06-29 DIAGNOSIS — N839 Noninflammatory disorder of ovary, fallopian tube and broad ligament, unspecified: Secondary | ICD-10-CM

## 2013-06-29 DIAGNOSIS — D279 Benign neoplasm of unspecified ovary: Secondary | ICD-10-CM

## 2013-06-29 DIAGNOSIS — D270 Benign neoplasm of right ovary: Secondary | ICD-10-CM

## 2013-06-29 DIAGNOSIS — D219 Benign neoplasm of connective and other soft tissue, unspecified: Secondary | ICD-10-CM

## 2013-06-29 MED ORDER — NORETHINDRONE 0.35 MG PO TABS: 1.0000 | ORAL_TABLET | Freq: Every day | ORAL | Status: DC

## 2013-06-29 NOTE — Progress Notes (Signed)
52 y.o.Marriedfemale here for a pelvic ultrasound to reassess ovaries and DUB.  U/s performed in June showing endometrial polyp, intramural fibroids, and right ovarian cysts/solid area.  Pt underwent hysteroscopic polyp resection and had not had any further irregular bleeding/spotting.  She is very happy about this.   Patient's last menstrual period was 06/25/2013.  Sexually active:  yes  Contraception: micronor  FINDINGS: UTERUS: 7.6 x 5.3. X 4.0cm with several small intramural fibroids, largest 3cm EMS: 2mm, thin ADNEXA:   Left ovary 3.3 x 1.5 x 1.6cm   Right ovary 3.2 x 1.5 x 1.4cm with resolution of cysts but persistance if 18 x 14 x 13mm solid area with calcifications.  Possible teratoma vs fibroma.  No change. CUL DE SAC: no free fluid  Images reviewed with pt.  As area is stable over last 5 months, feel this is ok to watch.  Ca-125 was 5.9 6/30.  I feel we should continue to monitor as may grow and need removal.  For now, pt will return for AEX in 6 months and then repeat PUS with me in 1 year.  Assessment:  Ovarian neoplasia, most likely benign, stable over 5 months with negative ca-125, uterine fibroids Plan: AEX 6 months and then repeat PUS one year.  Pt in agreement.  All questions answered.  ~15 minutes spent with patient >50% of time was in face to face discussion of above.

## 2013-06-30 ENCOUNTER — Encounter: Payer: Self-pay | Admitting: Obstetrics & Gynecology

## 2013-06-30 DIAGNOSIS — D279 Benign neoplasm of unspecified ovary: Secondary | ICD-10-CM

## 2013-06-30 HISTORY — DX: Benign neoplasm of unspecified ovary: D27.9

## 2013-06-30 NOTE — Patient Instructions (Signed)
Please call with any new problems/changes.

## 2013-07-04 ENCOUNTER — Ambulatory Visit: Payer: Self-pay | Admitting: Certified Nurse Midwife

## 2013-07-11 ENCOUNTER — Telehealth: Payer: Self-pay | Admitting: *Deleted

## 2013-07-11 NOTE — Telephone Encounter (Signed)
Call to patient, LMTCB. Has AEX scheduled for 12-2013 with Debbi. Will reschedule  To Dr Hyacinth Meeker with PUS.

## 2013-07-11 NOTE — Telephone Encounter (Signed)
Message copied by Alisa Graff on Tue Jul 11, 2013 12:28 PM ------      Message from: Jerene Bears      Created: Fri Jun 30, 2013  2:08 AM      Regarding: ultrasound       Kennon Rounds,      I told this pt today that I wanted her to return in 6 months for AEX and 1 year for repeat PUS.  I think I want to do both in 6 months.  Can you schedule and call her.  Can be done same day?  I know you don't like me to do that.  Both can be with me.  Then I've watched her ovary for a year. ------

## 2013-07-18 NOTE — Telephone Encounter (Signed)
Patient returned my call. Advised of Dr Rondel Baton recommendation for PUS and AEX in 6 months.  Appt rescheduled to Dr Hyacinth Meeker schedule for 12-28-12 with ultrasound/consult and AEX. Patient agreeable to date and time and aware appt with Debbi canceled.  Routing to provider for final review. Patient agreeable to disposition. Will close encounter

## 2013-07-20 ENCOUNTER — Telehealth: Payer: Self-pay | Admitting: *Deleted

## 2013-07-20 NOTE — Telephone Encounter (Signed)
6 moth PUS and AEX with Dr Hyacinth Meeker scheduled for 12-28-13.

## 2013-07-20 NOTE — Telephone Encounter (Signed)
Message copied by Alisa Graff on Thu Jul 20, 2013  9:25 AM ------      Message from: Jerene Bears      Created: Fri Jun 30, 2013  2:08 AM      Regarding: ultrasound       Nancy Carter,      I told this pt today that I wanted her to return in 6 months for AEX and 1 year for repeat PUS.  I think I want to do both in 6 months.  Can you schedule and call her.  Can be done same day?  I know you don't like me to do that.  Both can be with me.  Then I've watched her ovary for a year. ------

## 2013-08-03 ENCOUNTER — Ambulatory Visit (AMBULATORY_SURGERY_CENTER): Payer: 59

## 2013-08-03 VITALS — Ht 62.0 in | Wt 136.0 lb

## 2013-08-03 DIAGNOSIS — Z1211 Encounter for screening for malignant neoplasm of colon: Secondary | ICD-10-CM

## 2013-08-03 MED ORDER — MOVIPREP 100 G PO SOLR: 1.0000 | Freq: Once | ORAL | Status: DC

## 2013-08-21 ENCOUNTER — Ambulatory Visit (AMBULATORY_SURGERY_CENTER): Payer: 59 | Admitting: Internal Medicine

## 2013-08-21 ENCOUNTER — Encounter: Payer: Self-pay | Admitting: Internal Medicine

## 2013-08-21 VITALS — BP 123/87 | HR 51 | Temp 98.4°F | Resp 23

## 2013-08-21 DIAGNOSIS — Z1211 Encounter for screening for malignant neoplasm of colon: Secondary | ICD-10-CM

## 2013-08-21 HISTORY — PX: COLONOSCOPY WITH PROPOFOL: SHX5780

## 2013-08-21 MED ORDER — SODIUM CHLORIDE 0.9 % IV SOLN: 500.0000 mL | INTRAVENOUS | Status: DC

## 2013-08-21 NOTE — Patient Instructions (Signed)
YOU HAD AN ENDOSCOPIC PROCEDURE TODAY AT THE [facility]: Refer to the procedure report that was given to you for any specific questions about what was found during the examination.  If the procedure report does not answer your questions, please call your gastroenterologist to clarify.  If you requested that your care partner not be given the details of your procedure findings, then the procedure report has been included in a sealed envelope for you to review at your convenience later.  YOU SHOULD EXPECT: Some feelings of bloating in the abdomen. Passage of more gas than usual.  Walking can help get rid of the air that was put into your GI tract during the procedure and reduce the bloating. If you had a lower endoscopy (such as a colonoscopy or flexible sigmoidoscopy) you may notice spotting of blood in your stool or on the toilet paper. If you underwent a bowel prep for your procedure, then you may not have a normal bowel movement for a few days.  DIET: Your first meal following the procedure should be a light meal and then it is ok to progress to your normal diet.  A half-sandwich or bowl of soup is an example of a good first meal.  Heavy or fried foods are harder to digest and may make you feel nauseous or bloated.  Likewise meals heavy in dairy and vegetables can cause extra gas to form and this can also increase the bloating.  Drink plenty of fluids but you should avoid alcoholic beverages for 24 hours.  ACTIVITY: Your care partner should take you home directly after the procedure.  You should plan to take it easy, moving slowly for the rest of the day.  You can resume normal activity the day after the procedure however you should NOT DRIVE or use heavy machinery for 24 hours (because of the sedation medicines used during the test).    SYMPTOMS TO REPORT IMMEDIATELY: A gastroenterologist can be reached at any hour.  During normal business hours, 8:30 AM to 5:00 PM Monday through Friday,  call [phone].  After hours and on weekends, please call the GI answering service at [phone] who will take a message and have the physician on call contact you.   Following lower endoscopy (colonoscopy or flexible sigmoidoscopy):  Excessive amounts of blood in the stool  Significant tenderness or worsening of abdominal pains  Swelling of the abdomen that is new, acute  Fever of 100F or higher  FOLLOW UP: If any biopsies were taken you will be contacted by phone or by letter within the next 1-3 weeks.  Call your gastroenterologist if you have not heard about the biopsies in 3 weeks.  Our staff will call the home number listed on your records the next business day following your procedure to check on you and address any questions or concerns that you may have at that time regarding the information given to you following your procedure. This is a courtesy call and so if there is no answer at the home number and we have not heard from you through the emergency physician on call, we will assume that you have returned to your regular daily activities without incident.  SIGNATURES/CONFIDENTIALITY: You and/or your care partner have signed paperwork which will be entered into your electronic medical record.  These signatures attest to the fact that that the information above on your After Visit Summary has been reviewed and is understood.  Full responsibility of the confidentiality of this   discharge information lies with you and/or your care-p  Normal colonoscopy.  Repeat in 10 years.

## 2013-08-21 NOTE — Progress Notes (Signed)
Procedure ends, to recovery, report given and VSS. 

## 2013-08-21 NOTE — Op Note (Signed)
[place] [facility]  [address] [place], [address]   COLONOSCOPY PROCEDURE REPORT  PATIENT: Nancy Carter, Nancy Carter  MR#: [id number] BIRTHDATE: 04/04/1961 , 52  yrs. old GENDER: Female ENDOSCOPIST: Jerene Bears, MD REFERRED NU:[id number] Melford Aase, M.D. PROCEDURE DATE:  08/21/2013 PROCEDURE:   Colonoscopy, screening First Screening Colonoscopy - Avg.  risk and is 50 yrs.  old or older Yes.  Prior Negative Screening - Now for repeat screening. N/A  History of Adenoma - Now for follow-up colonoscopy & has been > or = to 3 yrs.  N/A  Polyps Removed Today? No.  Recommend repeat exam, <10 yrs? No. ASA CLASS:   Class II INDICATIONS:average risk screening and first colonoscopy. MEDICATIONS: MAC sedation, administered by CRNA and propofol (Diprivan) 300mg  IV  DESCRIPTION OF PROCEDURE:   After the risks benefits and alternatives of the procedure were thoroughly explained, informed consent was obtained.  A digital rectal exam revealed several skin tags.   The LB [id number]  endoscope was introduced through the anus and advanced to the cecum, which was identified by both the appendix and ileocecal valve. No adverse events experienced. The quality of the prep was good, using MoviPrep  The instrument was then slowly withdrawn as the colon was fully examined.   COLON FINDINGS: The colon was redundant.  Manual abdominal counter-pressure was used to reach the cecum.   A normal appearing cecum, ileocecal valve, and appendiceal orifice were identified. The ascending, hepatic flexure, transverse, splenic flexure, descending, sigmoid colon and rectum appeared unremarkable.  No polyps or cancers were seen.  Retroflexed views revealed external hemorrhoids. The time to cecum=12 minutes 47 seconds.  Withdrawal time=7 minutes 22 seconds.  The scope was withdrawn and the procedure completed. COMPLICATIONS: There were no complications.  ENDOSCOPIC IMPRESSION: 1.    Normal  colon  RECOMMENDATIONS: You should continue to follow colorectal cancer screening guidelines for "routine risk" patients with a repeat colonoscopy in 10 years. There is no need for FOBT (stool) testing for at least 5 years.   eSigned:  Jerene Bears, MD 08/21/2013 8:27 AM   cc: The Patient and Unk Pinto, MD

## 2013-08-22 ENCOUNTER — Telehealth: Payer: Self-pay | Admitting: *Deleted

## 2013-08-22 NOTE — Telephone Encounter (Signed)
  Follow up Call-  Call back number 08/21/2013  Post procedure Call Back phone  # [phone]  Permission to leave phone message Yes     Patient questions:  Do you have a fever, pain , or abdominal swelling? no Pain Score  0 *  Have you tolerated food without any problems? yes  Have you been able to return to your normal activities? yes  Do you have any questions about your discharge instructions: Diet   no Medications  no Follow up visit  no  Do you have questions or concerns about your Care? no  Actions: * If pain score is 4 or above: No action needed, pain <4.

## 2013-09-06 NOTE — Telephone Encounter (Signed)
See PUS report from 04-2013 OV.  Routed to provider for final signature, encounter closed.

## 2013-09-06 NOTE — Telephone Encounter (Signed)
Patient had PUS 06-2013 and has follow-up may 2015.  Routed to provider for signature, encounter closed.

## 2013-10-05 ENCOUNTER — Other Ambulatory Visit: Payer: Self-pay

## 2013-10-05 ENCOUNTER — Other Ambulatory Visit: Payer: Self-pay | Admitting: Obstetrics & Gynecology

## 2013-12-28 ENCOUNTER — Ambulatory Visit: Payer: Commercial Managed Care - PPO | Admitting: Certified Nurse Midwife

## 2013-12-28 ENCOUNTER — Other Ambulatory Visit: Payer: Commercial Managed Care - PPO | Admitting: Obstetrics & Gynecology

## 2013-12-28 ENCOUNTER — Ambulatory Visit (INDEPENDENT_AMBULATORY_CARE_PROVIDER_SITE_OTHER): Payer: Commercial Managed Care - PPO

## 2013-12-28 ENCOUNTER — Ambulatory Visit (INDEPENDENT_AMBULATORY_CARE_PROVIDER_SITE_OTHER): Payer: Commercial Managed Care - PPO | Admitting: Obstetrics & Gynecology

## 2013-12-28 VITALS — BP 116/64 | HR 56 | Ht 62.5 in | Wt 140.0 lb

## 2013-12-28 DIAGNOSIS — N926 Irregular menstruation, unspecified: Secondary | ICD-10-CM

## 2013-12-28 DIAGNOSIS — D279 Benign neoplasm of unspecified ovary: Secondary | ICD-10-CM

## 2013-12-28 DIAGNOSIS — D219 Benign neoplasm of connective and other soft tissue, unspecified: Secondary | ICD-10-CM

## 2013-12-28 DIAGNOSIS — D259 Leiomyoma of uterus, unspecified: Secondary | ICD-10-CM

## 2013-12-28 NOTE — Progress Notes (Signed)
Patient scheduled for annual exam in 06/2014 with Dr. Sabra Heck and pelvic ultrasound 01/03/15 Patient agreeable to appointment times/date/location.

## 2013-12-28 NOTE — Progress Notes (Signed)
53 y.o.Marriedfemale here for a pelvic ultrasound to recheck fibroids and right ovary with small questionable solid area.  Pt's cycles are normal without any spotting.  Very pleased with this.  Still on micronor.  Denies pain or other gynecologic concerns.  No LMP recorded.  Sexually active:  yes  Contraception: micronor  FINDINGS: UTERUS: 7.4 x 5.9  X 5.0cm with 3.6cm intramural fibroids which has increased since 11/14 ultrasound.  23m fibroid unchanged. EMS: 2.25mm, thin ADNEXA:         Left ovary 2.2 x 1.8 x 1.5cm                         Right ovary 3.3 x 2.0 x 1.3cm.  Ovary does appear different today.  Solid area not visible.  Two 13 mm cysts with internal echoes notes today.  Another simple 63mm cyst noted.  All areas are avascular CUL DE SAC: no free fluid  Pt did have normal ca-125 6/14.  Feel ovaries need to continue to be monitored as well as uterine fibroid that is mildly enlarged from prior images.  Will repeat physical exam and PUS in 6 months and then repeat PUS again in one year.  Will plan PUS and AEX at same time.  Pt in agreement with plan.  All questions answered.  Assessment:  Complex, but small, right ovarian cysts--avascular.  3.6cm uterine fibroid Plan: Repeat physical exam in November with PUS then, if indicated.  Repeat PUS one year.  ~15 minutes spent with patient >50% of time was in face to face discussion of above.

## 2014-01-04 ENCOUNTER — Other Ambulatory Visit: Payer: Commercial Managed Care - PPO

## 2014-01-22 ENCOUNTER — Encounter: Payer: Self-pay | Admitting: Obstetrics & Gynecology

## 2014-01-27 ENCOUNTER — Telehealth: Payer: Self-pay | Admitting: Obstetrics & Gynecology

## 2014-01-27 NOTE — Telephone Encounter (Signed)
Encounter opened in error

## 2014-06-09 DIAGNOSIS — I10 Essential (primary) hypertension: Secondary | ICD-10-CM | POA: Insufficient documentation

## 2014-06-09 DIAGNOSIS — D649 Anemia, unspecified: Secondary | ICD-10-CM | POA: Insufficient documentation

## 2014-06-11 ENCOUNTER — Ambulatory Visit (INDEPENDENT_AMBULATORY_CARE_PROVIDER_SITE_OTHER): Payer: 59 | Admitting: Physician Assistant

## 2014-06-11 ENCOUNTER — Encounter: Payer: Self-pay | Admitting: Physician Assistant

## 2014-06-11 VITALS — BP 128/78 | HR 60 | Temp 98.6°F | Resp 16 | Ht 63.0 in | Wt 145.0 lb

## 2014-06-11 DIAGNOSIS — I10 Essential (primary) hypertension: Secondary | ICD-10-CM

## 2014-06-11 DIAGNOSIS — Z86018 Personal history of other benign neoplasm: Secondary | ICD-10-CM | POA: Insufficient documentation

## 2014-06-11 DIAGNOSIS — Z872 Personal history of diseases of the skin and subcutaneous tissue: Secondary | ICD-10-CM

## 2014-06-11 DIAGNOSIS — R6889 Other general symptoms and signs: Secondary | ICD-10-CM

## 2014-06-11 DIAGNOSIS — Z0001 Encounter for general adult medical examination with abnormal findings: Secondary | ICD-10-CM

## 2014-06-11 LAB — CBC WITH DIFFERENTIAL/PLATELET
Basophils Absolute: 0.1 10*3/uL (ref 0.0–0.1)
Basophils Relative: 1 % (ref 0–1)
EOS ABS: 0.1 10*3/uL (ref 0.0–0.7)
EOS PCT: 1 % (ref 0–5)
HCT: 42.3 % (ref 36.0–46.0)
HEMOGLOBIN: 14.1 g/dL (ref 12.0–15.0)
Lymphocytes Relative: 24 % (ref 12–46)
Lymphs Abs: 1.8 10*3/uL (ref 0.7–4.0)
MCH: 30.1 pg (ref 26.0–34.0)
MCHC: 33.3 g/dL (ref 30.0–36.0)
MCV: 90.2 fL (ref 78.0–100.0)
MONO ABS: 0.4 10*3/uL (ref 0.1–1.0)
MONOS PCT: 5 % (ref 3–12)
NEUTROS PCT: 69 % (ref 43–77)
Neutro Abs: 5.3 10*3/uL (ref 1.7–7.7)
Platelets: 241 10*3/uL (ref 150–400)
RBC: 4.69 MIL/uL (ref 3.87–5.11)
RDW: 12.8 % (ref 11.5–15.5)
WBC: 7.7 10*3/uL (ref 4.0–10.5)

## 2014-06-11 LAB — HEMOGLOBIN A1C
Hgb A1c MFr Bld: 5.6 % (ref <5.7)
Mean Plasma Glucose: 114 mg/dL (ref <117)

## 2014-06-11 NOTE — Patient Instructions (Signed)

## 2014-06-11 NOTE — Progress Notes (Signed)
Complete Physical  Assessment and Plan: HTN- controlled, continue diet/exercise Anemia- check CBC, increase leafy greens Vitamin D def- continue supplement Health Maintenance  Discussed med's effects and SE's. Screening labs and tests as requested with regular follow-up as recommended.  HPI  53 y.o. female  presents for a complete physical.   Her blood pressure has been controlled at home, today their BP is BP: 128/78 mmHg She does not workout, has been very busy at work. She denies chest pain, shortness of breath, dizziness.  She is not on cholesterol medication and denies myalgias. Her cholesterol is at goal. The cholesterol last visit was:  LDL 72 Last A1C in the office was: 5.6 Patient is on Vitamin D supplement.  Vitamin D 54 She is on an mini estrogen pill and bASA.     Current Medications:  Current Outpatient Prescriptions on File Prior to Visit  Medication Sig Dispense Refill  . aspirin 81 MG tablet Take 81 mg by mouth daily.      . cholecalciferol (VITAMIN D) 1000 UNITS tablet Take 1,000-2,000 Units by mouth See admin instructions. Takes 2000 iu on Monday, Wednesday, Fridays and 1000 iu on tuesdays and Thursdays      . norethindrone (MICRONOR,CAMILA,ERRIN) 0.35 MG tablet Take 1 tablet (0.35 mg total) by mouth daily.  3 Package  3   No current facility-administered medications on file prior to visit.   Health Maintenance:   Immunization History  Administered Date(s) Administered  . Td 05/14/2006  . Tdap 06/08/2013   Tetanus: 2014 Pneumovax: Flu vaccine: 2014 at work Zostavax: LMP: 05/24/2014 Pap: 04/2013 will get next month MGM:  05/2013 will get next month DEXA: Colonoscopy: 08/2013 due in 10 years EGD: Last Dental Exam: Dr. Archie Balboa 04/2014 Last Eye Exam: Dr. Roxy Manns 01/2014 glasses Still working as rehab nurse at [employer]  Patient Care Team: Unk Pinto, MD as PCP - General (Internal Medicine) Jerene Bears, MD as Consulting Physician  (Gastroenterology) Lyman Speller, MD as Consulting Physician (Gynecology)  Allergies: No Known Allergies Medical History:  Past Medical History  Diagnosis Date  . 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 Hypertension     pt not taking medications at this time  . Anemia    Surgical History:  Past Surgical History  Procedure Laterality Date  . Laparoscopic cholecystectomy    . Dilatation & currettage/hysteroscopy with resectocope N/A 04/10/2013    Procedure: DILATATION & CURETTAGE/HYSTEROSCOPY WITH RESECTOCOPE;  Surgeon: Lyman Speller, MD;  Location: [facility];  Service: Gynecology;  Laterality: N/A;   Family History:  Family History  Problem Relation Age of Onset  . Diabetes Maternal Grandmother   . Hypertension Maternal Grandfather   . Hypertension Paternal Grandfather   . Diabetes Mother   . Hypertension Mother   . Hypertension Brother   . Colon cancer Neg Hx   . Pancreatic cancer Neg Hx   . Stomach cancer Neg Hx    Social History:  History  Substance Use Topics  . Smoking status: Never Smoker   . Smokeless tobacco: Never Used  . Alcohol Use: No     Review of Systems: [X]  = complains of  [ ]  = denies  General: Fatigue [ ]  Fever [ ]  Chills [ ]  Weakness [ ]   Insomnia [ ] Weight change [ ]  Night sweats [ ]   Change in appetite [ ]  Head: Head Trauma [ ]  Eyes: Wears glasses or corrective lens [ ]  Redness [ ]  Blurred vision [ ]   Diplopia [ ]  Discharge [ ]  Floaters [ ]  KVQ:QVZDGLO [ ]  hearing loss [ ]  Tinnitus [ ]  Ear Discharge [ ]   Congestion [ ]  Sinus Pain [ ]  Post Nasal Drip [ ]  Nose Bleeds [ ]  Rhinorrhea [ ]    Difficulty Swallowing [ ]  Snoring [ ]  Sore Throat [ ]  Cardiac:   Chest pain/pressure [ ]  SOB [ ]  Orthopnea [ ]   Palpitations [ ]   Paroxysmal nocturnal dyspnea[ ]  Claudication [ ]  Edema [ ]  Difficulty walking around block or climbing stairs [ ]  Pulmonary: Cough [ ]  Wheezing[ ]   SOB [ ]   Pleurisy [ ]  Asthma [ ]  GI: Nausea [ ]  Vomiting[  ] Dysphagia[ ]  Heartburn[ ]  Abdominal pain [ ]  Constipation [ ] ; Diarrhea [ ]  BRBPR [ ]  Melena[ ]  Bloating [ ]  Hemorrhoids [ ]  Incontinence [ ]  GU: Hematuria[ ]  Dysuria [ ]  Nocturia[ ]  Urgency [ ]   Hesitancy [ ]  Discharge [ ]  Frequency [ ]  Incontinence [ ]  Breast:  Dimpling [ ]  Breast lumps [ ]   Breast Lesions [ ]  Nipple discharge [ ]    Neuro: Headaches[ ]  Vertigo[ ]  Paresthesias[ ]  Spasm [ ]  Speech changes [ ]  Incoordination [ ]  Dizziness [ ]  Numbness [ ]  Ortho: Arthritis [ ]  Joint pain [ ]  Muscle pain [ ]  Joint swelling [ ]  Back Pain [ ]  Weakness [ ]  Stiffness [ ]  Skin:  Rash [ ]   Pruritis [ ]  Change in skin lesion [ ]  Change in hair [ ]  Change in nails [ ]  Psych: Depression[ ]  Anxiety[ ]  Stress [ ]  Confusion [ ]  Memory loss [ ]   Heme/Lymph: Bleeding [ ]  Bruising [ ]  History of anemia [ ]  Enlarged lymph nodes [ ]   Endocrine: Visual blurring [ ]  Paresthesia [ ]  Polyuria [ ]  Polydipsia [ ]  Polyphagia [ ]   Heat/cold intolerance [ ]  Hypoglycemia [ ]  Thyroid Issues [ ]  Diabetes [ ]   Physical Exam: Estimated body mass index is 25.69 kg/(m^2) as calculated from the following:   Height as of this encounter: 5\' 3"  (1.6 m).   Weight as of this encounter: 145 lb (65.772 kg). BP 128/78  Pulse 60  Temp(Src) 98.6 F (37 C)  Resp 16  Ht 5\' 3"  (1.6 m)  Wt 145 lb (65.772 kg)  BMI 25.69 kg/m2 General Appearance: Well nourished, in no apparent distress.  Eyes: PERRLA, EOMs, conjunctiva no swelling or erythema, normal fundi and vessels.  Sinuses: No Frontal/maxillary tenderness  ENT/Mouth: Ext aud canals clear, normal light reflex with TMs without erythema, bulging. Good dentition. No erythema, swelling, or exudate on post pharynx. Tonsils not swollen or erythematous. Hearing normal.  Neck: Supple, thyroid normal. No bruits  Respiratory: Respiratory effort normal, BS equal bilaterally without rales, rhonchi, wheezing or stridor.  Cardio: RRR without murmurs, rubs or gallops. Brisk peripheral pulses without  edema.  Chest: symmetric, with normal excursions and percussion.  Breasts: Symmetric, without lumps, nipple discharge, retractions.  Abdomen: Soft, nontender, no guarding, rebound, hernias, masses, or organomegaly. .  Lymphatics: Non tender without lymphadenopathy.  Genitourinary: defer Musculoskeletal: Full ROM all peripheral extremities,5/5 strength, and normal gait.  Skin: Warm, dry without rashes, lesions, ecchymosis. Neuro: Cranial nerves intact, reflexes equal bilaterally. Normal muscle tone, no cerebellar symptoms. Sensation intact.  Psych: Awake and oriented X 3, normal affect, Insight and Judgment appropriate.   EKG: defer   Vicie Mutters 9:28 AM [facility]

## 2014-06-12 LAB — INSULIN, FASTING: Insulin fasting, serum: 5.3 u[IU]/mL (ref 2.0–19.6)

## 2014-06-12 LAB — URINALYSIS, MICROSCOPIC ONLY
BACTERIA UA: NONE SEEN
CASTS: NONE SEEN
Crystals: NONE SEEN
Squamous Epithelial / LPF: NONE SEEN

## 2014-06-12 LAB — URINALYSIS, ROUTINE W REFLEX MICROSCOPIC
BILIRUBIN URINE: NEGATIVE
Glucose, UA: NEGATIVE mg/dL
KETONES UR: NEGATIVE mg/dL
Leukocytes, UA: NEGATIVE
NITRITE: NEGATIVE
Protein, ur: NEGATIVE mg/dL
SPECIFIC GRAVITY, URINE: 1.008 (ref 1.005–1.030)
Urobilinogen, UA: 0.2 mg/dL (ref 0.0–1.0)
pH: 5 (ref 5.0–8.0)

## 2014-06-12 LAB — HEPATIC FUNCTION PANEL
ALT: 10 U/L (ref 0–35)
AST: 16 U/L (ref 0–37)
Albumin: 4.6 g/dL (ref 3.5–5.2)
Alkaline Phosphatase: 51 U/L (ref 39–117)
BILIRUBIN DIRECT: 0.1 mg/dL (ref 0.0–0.3)
Indirect Bilirubin: 0.3 mg/dL (ref 0.2–1.2)
Total Bilirubin: 0.4 mg/dL (ref 0.2–1.2)
Total Protein: 7.3 g/dL (ref 6.0–8.3)

## 2014-06-12 LAB — BASIC METABOLIC PANEL WITH GFR
BUN: 9 mg/dL (ref 6–23)
CO2: 23 meq/L (ref 19–32)
Calcium: 9.4 mg/dL (ref 8.4–10.5)
Chloride: 104 mEq/L (ref 96–112)
Creat: 0.82 mg/dL (ref 0.50–1.10)
GFR, Est African American: >89 mL/min
GFR, Est Non African American: 83 mL/min
GLUCOSE: 81 mg/dL (ref 70–99)
Potassium: 4.1 mEq/L (ref 3.5–5.3)
SODIUM: 139 meq/L (ref 135–145)

## 2014-06-12 LAB — IRON AND TIBC
%SAT: 15 % — ABNORMAL LOW (ref 20–55)
IRON: 54 ug/dL (ref 42–145)
TIBC: 355 ug/dL (ref 250–470)
UIBC: 301 ug/dL (ref 125–400)

## 2014-06-12 LAB — VITAMIN D 25 HYDROXY (VIT D DEFICIENCY, FRACTURES): Vit D, 25-Hydroxy: 41 ng/mL (ref 30–89)

## 2014-06-12 LAB — LIPID PANEL
CHOL/HDL RATIO: 3 ratio
CHOLESTEROL: 147 mg/dL (ref 0–200)
HDL: 49 mg/dL (ref >39)
LDL Cholesterol: 86 mg/dL (ref 0–99)
TRIGLYCERIDES: 62 mg/dL (ref <150)
VLDL: 12 mg/dL (ref 0–40)

## 2014-06-12 LAB — FERRITIN: FERRITIN: 58 ng/mL (ref 10–291)

## 2014-06-12 LAB — MICROALBUMIN / CREATININE URINE RATIO
CREATININE, URINE: 38.5 mg/dL
Microalb Creat Ratio: 20.8 mg/g (ref 0.0–30.0)
Microalb, Ur: 0.8 mg/dL (ref <2.0)

## 2014-06-12 LAB — TSH: TSH: 2.245 u[IU]/mL (ref 0.350–4.500)

## 2014-06-12 LAB — MAGNESIUM: MAGNESIUM: 1.9 mg/dL (ref 1.5–2.5)

## 2014-06-12 LAB — VITAMIN B12: Vitamin B-12: 411 pg/mL (ref 211–911)

## 2014-06-18 ENCOUNTER — Other Ambulatory Visit: Payer: Self-pay | Admitting: Obstetrics & Gynecology

## 2014-06-18 NOTE — Telephone Encounter (Signed)
Yes.  OK to refill.  RF done.

## 2014-06-18 NOTE — Telephone Encounter (Signed)
Thank You.

## 2014-06-18 NOTE — Telephone Encounter (Signed)
Last refilled: 06/29/13 #3/3 refills Last AEX: 04/28/12 with Ms.Debbie AEX Scheduled: 07/03/14 with Dr. Sabra Heck Last Mammogram: 05/19/13 Bi-Rads 2  Okay to refill?

## 2014-07-03 ENCOUNTER — Encounter: Payer: Self-pay | Admitting: Obstetrics & Gynecology

## 2014-07-03 ENCOUNTER — Ambulatory Visit (INDEPENDENT_AMBULATORY_CARE_PROVIDER_SITE_OTHER): Payer: Commercial Managed Care - PPO | Admitting: Obstetrics & Gynecology

## 2014-07-03 VITALS — BP 116/72 | HR 60 | Ht 62.5 in | Wt 144.0 lb

## 2014-07-03 DIAGNOSIS — Z124 Encounter for screening for malignant neoplasm of cervix: Secondary | ICD-10-CM

## 2014-07-03 DIAGNOSIS — Z01419 Encounter for gynecological examination (general) (routine) without abnormal findings: Secondary | ICD-10-CM

## 2014-07-03 MED ORDER — NORETHINDRONE 0.35 MG PO TABS: ORAL_TABLET | ORAL | Status: DC

## 2014-07-03 NOTE — Progress Notes (Signed)
Patient ID: Nancy Carter, female   DOB: [birth year], 53 y.o.   MRN: [id number]   53 y.o. G0P0000 MarriedCaucasianF here for annual exam.  Flow is still irregular.  Flow is light, typically three days.  Has gone as far as 75 days between cycles.    PCP:  Dr. Melford Carter.  Saw one months ago.  Labs all normal per pt.    Patient's last menstrual period was 06/14/2014.          Sexually active: Yes.    The current method of family planning is oral progesterone-only contraceptive.    Exercising: Yes.    walking Smoker:  no  Health Maintenance: Pap:  04/28/12, negative with neg HR HPV History of abnormal Pap:  yes MMG:  05/19/13 MMG, US-normal Colonoscopy:  08/21/13-repeat in 10 years BMD:   None TDaP:  9/07  Screening Labs: PCP, Hb today: PCP, Urine today: PCP   reports that she has never smoked. She has never used smokeless tobacco. She reports that she does not drink alcohol or use illicit drugs.  Past Medical History  Diagnosis Date  . 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 Hypertension     pt not taking medications at this time  . Anemia     Past Surgical History  Procedure Laterality Date  . Laparoscopic cholecystectomy    . Dilatation & currettage/hysteroscopy with resectocope N/A 04/10/2013    Procedure: DILATATION & CURETTAGE/HYSTEROSCOPY WITH RESECTOCOPE;  Surgeon: Lyman Speller, MD;  Location: [facility];  Service: Gynecology;  Laterality: N/A;    Current Outpatient Prescriptions  Medication Sig Dispense Refill  . aspirin 81 MG tablet Take 81 mg by mouth daily.    . cholecalciferol (VITAMIN D) 1000 UNITS tablet Take 1,000-2,000 Units by mouth See admin instructions. Takes 2000 iu on Monday, Wednesday, Fridays and 1000 iu on tuesdays and Thursdays    . HEATHER 0.35 MG tablet TAKE 1 TABLET (0.35 MG TOTAL) BY MOUTH DAILY. 3 Package 0   No current facility-administered medications for this visit.    Family History  Problem Relation Age of Onset  .  Diabetes Maternal Grandmother   . Hypertension Maternal Grandfather   . Hypertension Paternal Grandfather   . Diabetes Mother   . Hypertension Mother   . Hypertension Brother   . Colon cancer Neg Hx   . Pancreatic cancer Neg Hx   . Stomach cancer Neg Hx     ROS:  Pertinent items are noted in HPI.  Otherwise, a comprehensive ROS was negative.  Exam:   BP 116/72 mmHg  Pulse 60  Ht 5' 2.5" (1.588 m)  Wt 144 lb (65.318 kg)  BMI 25.90 kg/m2  LMP 06/14/2014   Height: 5' 2.5" (158.8 cm)  Ht Readings from Last 3 Encounters:  07/03/14 5' 2.5" (1.588 m)  06/11/14 5\' 3"  (1.6 m)  12/28/13 5' 2.5" (1.588 m)    General appearance: alert, cooperative and appears stated age Head: Normocephalic, without obvious abnormality, atraumatic Neck: no adenopathy, supple, symmetrical, trachea midline and thyroid normal to inspection and palpation Lungs: clear to auscultation bilaterally Breasts: normal appearance, no masses or tenderness Heart: regular rate and rhythm Abdomen: soft, non-tender; bowel sounds normal; no masses,  no organomegaly Extremities: extremities normal, atraumatic, no cyanosis or edema Skin: Skin color, texture, turgor normal. No rashes or lesions Lymph nodes: Cervical, supraclavicular, and axillary nodes normal. No abnormal inguinal nodes palpated Neurologic: Grossly normal   Pelvic: External genitalia:  no lesions  Urethra:  normal appearing urethra with no masses, tenderness or lesions              Bartholins and Skenes: normal                 Vagina: normal appearing vagina with normal color and discharge, no lesions              Cervix: no lesions              Pap taken: Yes.   Bimanual Exam:  Uterus:  enlarged, 8 weeks size, stable from prior exam              Adnexa: no mass, fullness, tenderness               Rectovaginal: Confirms               Anus:  normal sphincter tone, no lesions  A:  Well Woman with normal exam DUB which has improved on  progesterone only OCPs 3.6cm uterine fibroid, slight increase in size  H/O breast cyst and benign breast calcifications Hypertension  P:   Mammogram yearly.  Pt will schedule. pap smear today.  H/O neg pap with neg HR HPV Norethindrone rx for 90 day supply/4RF to pharmacy U/S scheduled for 5/16.  Pt already has appt. Labs with Dr. Melford Carter. return annually or prn  An After Visit Summary was printed and given to the patient.

## 2014-07-05 LAB — IPS PAP TEST WITH REFLEX TO HPV

## 2014-07-16 ENCOUNTER — Other Ambulatory Visit: Payer: Self-pay

## 2014-07-16 DIAGNOSIS — Z1231 Encounter for screening mammogram for malignant neoplasm of breast: Secondary | ICD-10-CM

## 2014-08-29 ENCOUNTER — Ambulatory Visit
Admission: RE | Admit: 2014-08-29 | Discharge: 2014-08-29 | Disposition: A | Discharge status: Home / Self Care | Payer: 59 | Source: Ambulatory Visit

## 2014-08-29 DIAGNOSIS — Z1231 Encounter for screening mammogram for malignant neoplasm of breast: Secondary | ICD-10-CM

## 2014-12-31 ENCOUNTER — Other Ambulatory Visit: Payer: Self-pay | Admitting: Emergency Medicine

## 2014-12-31 DIAGNOSIS — D251 Intramural leiomyoma of uterus: Secondary | ICD-10-CM

## 2015-01-03 ENCOUNTER — Ambulatory Visit (INDEPENDENT_AMBULATORY_CARE_PROVIDER_SITE_OTHER): Payer: 59 | Admitting: Obstetrics & Gynecology

## 2015-01-03 ENCOUNTER — Encounter: Payer: Self-pay | Admitting: Obstetrics & Gynecology

## 2015-01-03 ENCOUNTER — Ambulatory Visit (INDEPENDENT_AMBULATORY_CARE_PROVIDER_SITE_OTHER): Payer: 59

## 2015-01-03 VITALS — BP 124/82 | Wt 148.0 lb

## 2015-01-03 DIAGNOSIS — D251 Intramural leiomyoma of uterus: Secondary | ICD-10-CM

## 2015-01-03 DIAGNOSIS — D270 Benign neoplasm of right ovary: Secondary | ICD-10-CM | POA: Diagnosis not present

## 2015-01-03 DIAGNOSIS — N838 Other noninflammatory disorders of ovary, fallopian tube and broad ligament: Secondary | ICD-10-CM

## 2015-01-03 DIAGNOSIS — N839 Noninflammatory disorder of ovary, fallopian tube and broad ligament, unspecified: Secondary | ICD-10-CM

## 2015-01-03 NOTE — Progress Notes (Signed)
54 y.o. Nancy Carter here for a pelvic ultrasound to visualize uterine fibroids and right ovarian lesions that has been watched since 2014.  This was discovered on ultrasound done due to bleeding issues.  Pt reports she is doing well on the micronor.  Cycling is less frequent over last six months.  Denies pain.    No LMP recorded.  Sexually active:  yes  Contraception: oral progesterone-only contraceptive  FINDINGS: UTERUS: 7.1 x 4.4 x 3.7cm with 3.4 x 3.7cm uterine fibroid EMS: 3.9mm ADNEXA:   Left ovary 2.4 x 1.0 x 1.8cm   Right ovary 2.9 x 1.1 x 1.2cm with 15 x 64mm lesions with califications, avascular, 3D imaging shows cystic features inferiorly within lesion.  Was measured as 18 x 14 x 19mm in 2014 [place]: no free fluid  Images reviewed with pt from 2014, 2015 and now.  Lesion is somewhat ill defined but does have calcifications around it.  Really hasn't changed since 2014 so has been watched about two years.  Continues to be avascular.  Plan to repeat ca-125.  If any change, will consider MRI vs excision.  Pt would prefer non-surgical approach first if possible and appropriate.  Assessment:  Right ovarian mass that has calcifications but cystic inferiorly Uterine fibroids  Plan: ca 125.  Consider proceeding with pelvic MRI vs surgical excision with any change.  ~15 minutes spent with patient >50% of time was in face to face discussion of above

## 2015-01-04 LAB — CA 125: CA 125: 8 U/mL

## 2015-06-12 ENCOUNTER — Encounter: Payer: Self-pay | Admitting: Physician Assistant

## 2015-06-12 ENCOUNTER — Ambulatory Visit (INDEPENDENT_AMBULATORY_CARE_PROVIDER_SITE_OTHER): Payer: 59 | Admitting: Physician Assistant

## 2015-06-12 VITALS — BP 126/66 | HR 67 | Temp 97.9°F | Resp 16 | Ht 62.5 in | Wt 152.0 lb

## 2015-06-12 DIAGNOSIS — Z1389 Encounter for screening for other disorder: Secondary | ICD-10-CM

## 2015-06-12 DIAGNOSIS — Z13220 Encounter for screening for lipoid disorders: Secondary | ICD-10-CM

## 2015-06-12 DIAGNOSIS — I10 Essential (primary) hypertension: Secondary | ICD-10-CM | POA: Diagnosis not present

## 2015-06-12 DIAGNOSIS — D270 Benign neoplasm of right ovary: Secondary | ICD-10-CM

## 2015-06-12 DIAGNOSIS — E559 Vitamin D deficiency, unspecified: Secondary | ICD-10-CM

## 2015-06-12 DIAGNOSIS — D649 Anemia, unspecified: Secondary | ICD-10-CM

## 2015-06-12 DIAGNOSIS — Z0000 Encounter for general adult medical examination without abnormal findings: Secondary | ICD-10-CM | POA: Diagnosis not present

## 2015-06-12 DIAGNOSIS — Z86018 Personal history of other benign neoplasm: Secondary | ICD-10-CM

## 2015-06-12 DIAGNOSIS — Z1159 Encounter for screening for other viral diseases: Secondary | ICD-10-CM

## 2015-06-12 DIAGNOSIS — Z131 Encounter for screening for diabetes mellitus: Secondary | ICD-10-CM

## 2015-06-12 DIAGNOSIS — D251 Intramural leiomyoma of uterus: Secondary | ICD-10-CM

## 2015-06-12 LAB — CBC WITH DIFFERENTIAL/PLATELET
Basophils Absolute: 0.1 10*3/uL (ref 0.0–0.1)
Basophils Relative: 1 % (ref 0–1)
EOS PCT: 1 % (ref 0–5)
Eosinophils Absolute: 0.1 10*3/uL (ref 0.0–0.7)
HEMATOCRIT: 41.4 % (ref 36.0–46.0)
HEMOGLOBIN: 13.5 g/dL (ref 12.0–15.0)
LYMPHS ABS: 1.8 10*3/uL (ref 0.7–4.0)
LYMPHS PCT: 25 % (ref 12–46)
MCH: 29.9 pg (ref 26.0–34.0)
MCHC: 32.6 g/dL (ref 30.0–36.0)
MCV: 91.8 fL (ref 78.0–100.0)
MONO ABS: 0.4 10*3/uL (ref 0.1–1.0)
MONOS PCT: 6 % (ref 3–12)
MPV: 11.5 fL (ref 8.6–12.4)
NEUTROS ABS: 4.9 10*3/uL (ref 1.7–7.7)
Neutrophils Relative %: 67 % (ref 43–77)
Platelets: 223 10*3/uL (ref 150–400)
RBC: 4.51 MIL/uL (ref 3.87–5.11)
RDW: 12.6 % (ref 11.5–15.5)
WBC: 7.3 10*3/uL (ref 4.0–10.5)

## 2015-06-12 NOTE — Progress Notes (Signed)
Complete Physical  Assessment and Plan: 1. Essential hypertension - CBC with Differential/Platelet - BASIC METABOLIC PANEL WITH GFR - Hepatic function panel - TSH - Magnesium - EKG 12-Lead  2. Ovarian benign neoplasm, right Cont follow up  3. Anemia, unspecified anemia type - Iron and TIBC - Ferritin - Vitamin B12  4. H/O dysplastic nevus Follows DERM yearly  5. Intramural leiomyoma of uterus  6. Screening for diabetes mellitus - Hemoglobin A1c - Insulin, fasting  7. Screening for blood or protein in urine - Urinalysis, Routine w reflex microscopic (not at [facility]) - Microalbumin / creatinine urine ratio  8. Screening cholesterol level - Lipid panel  9. Routine general medical examination at a health care facility - CBC with Differential/Platelet - BASIC METABOLIC PANEL WITH GFR - Hepatic function panel - TSH - Lipid panel - Hemoglobin A1c - Magnesium - Insulin, fasting - Vit D  25 hydroxy (rtn osteoporosis monitoring) - Urinalysis, Routine w reflex microscopic (not at [facility]) - Microalbumin / creatinine urine ratio - Iron and TIBC - Ferritin - Vitamin B12 - HIV antibody - Hepatitis C antibody  10. Screening for viral disease - HIV antibody - Hepatitis C antibody  11. Vitamin D deficiency - Vit D  25 hydroxy (rtn osteoporosis monitoring)   Discussed med's effects and SE's. Screening labs and tests as requested with regular follow-up as recommended.  HPI  54 y.o. female  presents for a complete physical.   Her blood pressure has been controlled at home, today their BP is BP: 126/66 mmHg She does not workout, has been very busy at work. She denies chest pain, shortness of breath, dizziness.  She is not on cholesterol medication and denies myalgias. Her cholesterol is at goal. The cholesterol last visit was:  Lab Results  Component Value Date   CHOL 147 06/11/2014   HDL 49 06/11/2014   LDLCALC 86 06/11/2014   TRIG 62 06/11/2014   CHOLHDL 3.0  06/11/2014   Last A1C in the office was:  Lab Results  Component Value Date   HGBA1C 5.6 06/11/2014   Patient is on Vitamin D supplement.   Lab Results  Component Value Date   VD25OH 2 06/11/2014   She is on an mini estrogen pill and bASA. She follows with Dr. Sabra Heck for right ovarian mass being monitored x 2014, normal CA-125 in June.   BMI is Body mass index is 27.34 kg/(m^2)., she is working on diet and exercise. Wt Readings from Last 3 Encounters:  06/12/15 152 lb (68.947 kg)  01/03/15 148 lb (67.132 kg)  07/03/14 144 lb (65.318 kg)      Current Medications:  Current Outpatient Prescriptions on File Prior to Visit  Medication Sig Dispense Refill  . aspirin 81 MG tablet Take 81 mg by mouth daily.    . cholecalciferol (VITAMIN D) 1000 UNITS tablet Take 1,000-2,000 Units by mouth See admin instructions. Takes 2000 iu on Monday, Wednesday, Fridays and 1000 iu on tuesdays and Thursdays    . norethindrone (HEATHER) 0.35 MG tablet TAKE 1 TABLET (0.35 MG TOTAL) BY MOUTH DAILY. 3 Package 4   No current facility-administered medications on file prior to visit.   Health Maintenance:   Immunization History  Administered Date(s) Administered  . Td 05/14/2006  . Tdap 06/08/2013   Tetanus: 2014 Pneumovax: N/A Prevnar 13: N/A Flu vaccine: 2016 at work Zostavax: N/A LMP: Patient's last menstrual period was 04/06/2015. Pap: 06/2014  MGM: 08/2014 DEXA: N/A Colonoscopy: 08/2013 due in 10 years EGD: N/A  Last Dental Exam: Dr. Lawson Radar 05/2015 Last Eye Exam: Dr. Roxy Manns 03/2015, wears glasses Still working as rehab nurse at [employer]  Patient Care Team: Unk Pinto, MD as PCP - General (Internal Medicine) Jerene Bears, MD as Consulting Physician (Gastroenterology) Megan Salon, MD as Consulting Physician (Gynecology) Hillery Jacks, MD as Referring Physician (Dermatology)  Allergies: No Known Allergies Medical History:  Past Medical History  Diagnosis Date  . STD (sexually  transmitted disease) 02/91    Herpes vulvitis  . Hypertension     pt not taking medications at this time  . Anemia    Surgical History:  Past Surgical History  Procedure Laterality Date  . Laparoscopic cholecystectomy    . Dilatation & currettage/hysteroscopy with resectocope N/A 04/10/2013    Procedure: DILATATION & CURETTAGE/HYSTEROSCOPY WITH RESECTOCOPE;  Surgeon: Lyman Speller, MD;  Location: [facility];  Service: Gynecology;  Laterality: N/A;   Family History:  Family History  Problem Relation Age of Onset  . Diabetes Maternal Grandmother   . Hypertension Maternal Grandfather   . Hypertension Paternal Grandfather   . Diabetes Mother   . Hypertension Mother   . Hypertension Brother   . Colon cancer Neg Hx   . Pancreatic cancer Neg Hx   . Stomach cancer Neg Hx    Social History:  Social History  Substance Use Topics  . Smoking status: Never Smoker   . Smokeless tobacco: Never Used  . Alcohol Use: No   Review of Systems  Constitutional: Negative.   HENT: Negative.   Eyes: Negative.   Respiratory: Negative.   Cardiovascular: Negative.   Gastrointestinal: Negative.   Genitourinary: Negative.   Musculoskeletal: Negative.   Skin: Negative.   Neurological: Negative.   Endo/Heme/Allergies: Negative.   Psychiatric/Behavioral: Negative.     Physical Exam: Estimated body mass index is 27.34 kg/(m^2) as calculated from the following:   Height as of this encounter: 5' 2.5" (1.588 m).   Weight as of this encounter: 152 lb (68.947 kg). BP 126/66 mmHg  Pulse 67  Temp(Src) 97.9 F (36.6 C) (Temporal)  Resp 16  Ht 5' 2.5" (1.588 m)  Wt 152 lb (68.947 kg)  BMI 27.34 kg/m2  SpO2 99%  LMP 04/06/2015 General Appearance: Well nourished, in no apparent distress.  Eyes: PERRLA, EOMs, conjunctiva no swelling or erythema, normal fundi and vessels.  Sinuses: No Frontal/maxillary tenderness  ENT/Mouth: Ext aud canals clear, normal light reflex with TMs without erythema,  bulging. Good dentition. No erythema, swelling, or exudate on post pharynx. Tonsils not swollen or erythematous. Hearing normal.  Neck: Supple, thyroid normal. No bruits  Respiratory: Respiratory effort normal, BS equal bilaterally without rales, rhonchi, wheezing or stridor.  Cardio: RRR without murmurs, rubs or gallops. Brisk peripheral pulses without edema.  Chest: symmetric, with normal excursions and percussion.  Breasts: defer GYN  Abdomen: Soft, nontender, no guarding, rebound, hernias, masses, or organomegaly. .  Lymphatics: Non tender without lymphadenopathy.  Genitourinary: defer GYN Musculoskeletal: Full ROM all peripheral extremities,5/5 strength, and normal gait.  Skin: Warm, dry without rashes, lesions, ecchymosis. Follows Derm yearly Neuro: Cranial nerves intact, reflexes equal bilaterally. Normal muscle tone, no cerebellar symptoms. Sensation intact.  Psych: Awake and oriented X 3, normal affect, Insight and Judgment appropriate.   EKG: WNL, no ST changes   Vicie Mutters 9:17 AM [facility]

## 2015-06-12 NOTE — Patient Instructions (Signed)

## 2015-06-13 LAB — BASIC METABOLIC PANEL WITH GFR
BUN: 7 mg/dL (ref 7–25)
CALCIUM: 9.3 mg/dL (ref 8.6–10.4)
CO2: 23 mmol/L (ref 20–31)
Chloride: 102 mmol/L (ref 98–110)
Creat: 0.76 mg/dL (ref 0.50–1.05)
GLUCOSE: 77 mg/dL (ref 65–99)
Potassium: 3.9 mmol/L (ref 3.5–5.3)
Sodium: 140 mmol/L (ref 135–146)

## 2015-06-13 LAB — HIV ANTIBODY (ROUTINE TESTING W REFLEX): HIV: NONREACTIVE

## 2015-06-13 LAB — URINALYSIS, ROUTINE W REFLEX MICROSCOPIC
BILIRUBIN URINE: NEGATIVE
Glucose, UA: NEGATIVE
KETONES UR: NEGATIVE
Leukocytes, UA: NEGATIVE
NITRITE: NEGATIVE
PROTEIN: NEGATIVE
Specific Gravity, Urine: 1.005 (ref 1.001–1.035)
pH: 6.5 (ref 5.0–8.0)

## 2015-06-13 LAB — HEPATIC FUNCTION PANEL
ALBUMIN: 4.1 g/dL (ref 3.6–5.1)
ALT: 10 U/L (ref 6–29)
AST: 15 U/L (ref 10–35)
Alkaline Phosphatase: 65 U/L (ref 33–130)
BILIRUBIN INDIRECT: 0.3 mg/dL (ref 0.2–1.2)
Bilirubin, Direct: 0.1 mg/dL (ref <=0.2)
TOTAL PROTEIN: 7.2 g/dL (ref 6.1–8.1)
Total Bilirubin: 0.4 mg/dL (ref 0.2–1.2)

## 2015-06-13 LAB — URINALYSIS, MICROSCOPIC ONLY
BACTERIA UA: NONE SEEN [HPF]
CASTS: NONE SEEN [LPF]
CRYSTALS: NONE SEEN [HPF]
WBC, UA: NONE SEEN WBC/HPF (ref <=5)
YEAST: NONE SEEN [HPF]

## 2015-06-13 LAB — IRON AND TIBC
%SAT: 21 % (ref 11–50)
Iron: 71 ug/dL (ref 45–160)
TIBC: 339 ug/dL (ref 250–450)
UIBC: 268 ug/dL (ref 125–400)

## 2015-06-13 LAB — VITAMIN D 25 HYDROXY (VIT D DEFICIENCY, FRACTURES): VIT D 25 HYDROXY: 39 ng/mL (ref 30–100)

## 2015-06-13 LAB — LIPID PANEL
CHOLESTEROL: 148 mg/dL (ref 125–200)
HDL: 47 mg/dL (ref >=46)
LDL CALC: 85 mg/dL (ref <130)
TRIGLYCERIDES: 79 mg/dL (ref <150)
Total CHOL/HDL Ratio: 3.1 Ratio (ref <=5.0)
VLDL: 16 mg/dL (ref <30)

## 2015-06-13 LAB — VITAMIN B12: VITAMIN B 12: 575 pg/mL (ref 211–911)

## 2015-06-13 LAB — MAGNESIUM: MAGNESIUM: 1.9 mg/dL (ref 1.5–2.5)

## 2015-06-13 LAB — TSH: TSH: 1.58 u[IU]/mL (ref 0.350–4.500)

## 2015-06-13 LAB — MICROALBUMIN / CREATININE URINE RATIO: CREATININE, URINE: 23 mg/dL (ref 20–320)

## 2015-06-13 LAB — INSULIN, FASTING: INSULIN FASTING, SERUM: 5.9 u[IU]/mL (ref 2.0–19.6)

## 2015-06-13 LAB — HEPATITIS C ANTIBODY: HCV Ab: NEGATIVE

## 2015-06-13 LAB — FERRITIN: FERRITIN: 87 ng/mL (ref 10–291)

## 2015-06-13 LAB — HEMOGLOBIN A1C
Hgb A1c MFr Bld: 5.7 % — ABNORMAL HIGH (ref <5.7)
Mean Plasma Glucose: 117 mg/dL — ABNORMAL HIGH (ref <117)

## 2015-08-05 ENCOUNTER — Other Ambulatory Visit: Payer: Self-pay | Admitting: Obstetrics & Gynecology

## 2015-08-05 NOTE — Telephone Encounter (Signed)
Medication refill request: Heather tablet 0.35 mg Last AEX:  07/02/2014 SM Next AEX: 09/03/2015 SM Last MMG (if hormonal medication request): 08/29/2014 BIRADS 1 Refill authorized: 07/03/2014 Heather Tablet 0.35 mg #3 Packages 4 Refills  Today: #3 packages 4 Refills

## 2015-09-03 ENCOUNTER — Encounter: Payer: Self-pay | Admitting: Obstetrics & Gynecology

## 2015-09-03 ENCOUNTER — Ambulatory Visit (INDEPENDENT_AMBULATORY_CARE_PROVIDER_SITE_OTHER): Payer: 59 | Admitting: Obstetrics & Gynecology

## 2015-09-03 VITALS — BP 130/90 | HR 58 | Resp 12 | Ht 62.5 in | Wt 151.0 lb

## 2015-09-03 DIAGNOSIS — D259 Leiomyoma of uterus, unspecified: Secondary | ICD-10-CM

## 2015-09-03 DIAGNOSIS — N83299 Other ovarian cyst, unspecified side: Secondary | ICD-10-CM | POA: Diagnosis not present

## 2015-09-03 DIAGNOSIS — Z01419 Encounter for gynecological examination (general) (routine) without abnormal findings: Secondary | ICD-10-CM | POA: Diagnosis not present

## 2015-09-03 MED ORDER — NORETHINDRONE 0.35 MG PO TABS: 1.0000 | ORAL_TABLET | Freq: Every day | ORAL | Status: DC

## 2015-09-03 NOTE — Progress Notes (Signed)
55 y.o. G0P0000 MarriedCaucasianF here for annual exam.  Doing well.  Minimal bleeding with cycles.  Reports she skilled cycles twice this past month.  Bleeding only lasts for two or three days.  PCP:  Dr. Melford Aase.  Lab work was done then.  Reports this was normal.    Patient's last menstrual period was 08/16/2015.          Sexually active: Yes.    The current method of family planning is OCP (estrogen/progesterone).    Exercising: No.  The patient does not participate in regular exercise at present. Smoker:  no  Health Maintenance: Pap:  07/03/14 Neg. 04/2012 HR HPV:Neg History of abnormal Pap:  yes MMG:  08/29/14 BIRADS1:neg Colonoscopy:  08/21/13 Normal - repeat 10 years.  FOBT starting 2020 recommended  BMD:   Never TDaP:  05/2013 Screening Labs: PCP, Hb today: PCP, Urine today: PCP   reports that she has never smoked. She has never used smokeless tobacco. She reports that she does not drink alcohol or use illicit drugs.  Past Medical History  Diagnosis Date  . 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 Hypertension     pt not taking medications at this time  . Anemia     Past Surgical History  Procedure Laterality Date  . Laparoscopic cholecystectomy    . Dilatation & currettage/hysteroscopy with resectocope N/A 04/10/2013    Procedure: DILATATION & CURETTAGE/HYSTEROSCOPY WITH RESECTOCOPE;  Surgeon: Lyman Speller, MD;  Location: [facility];  Service: Gynecology;  Laterality: N/A;    Current Outpatient Prescriptions  Medication Sig Dispense Refill  . aspirin 81 MG tablet Take 81 mg by mouth daily.    . calcium carbonate (TUMS - DOSED IN MG ELEMENTAL CALCIUM) 500 MG chewable tablet Chew 1 tablet by mouth daily.    . cholecalciferol (VITAMIN D) 1000 UNITS tablet Take 1,000-2,000 Units by mouth See admin instructions. Takes 2000 iu on Monday, Wednesday, Fridays and 1000 iu on tuesdays and Thursdays    . norethindrone (MICRONOR,CAMILA,ERRIN) 0.35 MG tablet  TAKE 1 TABLET BY MOUTH DAILY. 84 tablet 1   No current facility-administered medications for this visit.    Family History  Problem Relation Age of Onset  . Diabetes Maternal Grandmother   . Hypertension Maternal Grandfather   . Hypertension Paternal Grandfather   . Diabetes Mother   . Hypertension Mother   . Hypertension Brother   . Colon cancer Neg Hx   . Pancreatic cancer Neg Hx   . Stomach cancer Neg Hx     ROS:  Pertinent items are noted in HPI.  Otherwise, a comprehensive ROS was negative.  Exam:   BP 130/90 mmHg  Pulse 58  Resp 12  Ht 5' 2.5" (1.588 m)  Wt 151 lb (68.493 kg)  BMI 27.16 kg/m2  LMP 08/16/2015  Weight change: +7#:   Height: 5' 2.5" (158.8 cm)  Ht Readings from Last 3 Encounters:  09/03/15 5' 2.5" (1.588 m)  06/12/15 5' 2.5" (1.588 m)  07/03/14 5' 2.5" (1.588 m)    General appearance: alert, cooperative and appears stated age Head: Normocephalic, without obvious abnormality, atraumatic Neck: no adenopathy, supple, symmetrical, trachea midline and thyroid normal to inspection and palpation Lungs: clear to auscultation bilaterally Breasts: normal appearance, no masses or tenderness Heart: regular rate and rhythm Abdomen: soft, non-tender; bowel sounds normal; no masses,  no organomegaly Extremities: extremities normal, atraumatic, no cyanosis or edema Skin: Skin color, texture, turgor normal. No rashes or lesions Lymph  nodes: Cervical, supraclavicular, and axillary nodes normal. No abnormal inguinal nodes palpated Neurologic: Grossly normal   Pelvic: External genitalia:  no lesions              Urethra:  normal appearing urethra with no masses, tenderness or lesions              Bartholins and Skenes: normal                 Vagina: normal appearing vagina with normal color and discharge, no lesions              Cervix: no lesions              Pap taken: No. Bimanual Exam:  Uterus:  gobluar and about 8 weeks size, consistent with fibroids               Adnexa: normal adnexa               Rectovaginal: Confirms               Anus:  normal sphincter tone, no lesions, hemorrhoids, non-thrombosed  Chaperone was present for exam.  A:  Well Woman with normal exam DUB which has improved on progesterone only OCPs 3.6cm uterine fibroid and complex but avascular ovarian cyst on pUS H/O breast cyst and benign breast calcifications Hypertension  P: Mammogram yearly. Pt will schedule. Normal pap 2015.  No pap today.  H/O neg pap with neg HR HPV 2013. Norethindrone rx for 90 day supply/4RF to pharmacy U/S scheduled for 5/17 will be scheduled Labs with Dr. Melford Aase return annually or prn

## 2015-09-03 NOTE — Addendum Note (Signed)
Addended by: Michele Mcalpine on: 09/03/2015 02:01 PM   Modules accepted: Orders

## 2015-09-03 NOTE — Progress Notes (Signed)
Patient scheduled for ultrasound 12/31/15 with Dr. Sabra Heck

## 2015-09-04 ENCOUNTER — Telehealth: Payer: Self-pay | Admitting: Obstetrics & Gynecology

## 2015-09-04 NOTE — Telephone Encounter (Signed)
Called patient to discuss benefits for a procedure. Left Voicemail requesting a call. °

## 2015-10-28 MED FILL — NORETHINDRONE 0.35 MG TAB: 0.35 | 84 days supply | Qty: 84 | Fill #1

## 2015-12-19 ENCOUNTER — Other Ambulatory Visit: Payer: Self-pay

## 2015-12-19 DIAGNOSIS — Z1231 Encounter for screening mammogram for malignant neoplasm of breast: Secondary | ICD-10-CM

## 2015-12-27 ENCOUNTER — Ambulatory Visit: Payer: 59 | Admitting: Obstetrics & Gynecology

## 2015-12-31 ENCOUNTER — Ambulatory Visit (INDEPENDENT_AMBULATORY_CARE_PROVIDER_SITE_OTHER): Payer: 59 | Admitting: Obstetrics & Gynecology

## 2015-12-31 ENCOUNTER — Ambulatory Visit (INDEPENDENT_AMBULATORY_CARE_PROVIDER_SITE_OTHER): Payer: 59

## 2015-12-31 ENCOUNTER — Encounter: Payer: Self-pay | Admitting: Obstetrics & Gynecology

## 2015-12-31 ENCOUNTER — Ambulatory Visit
Admission: RE | Admit: 2015-12-31 | Discharge: 2015-12-31 | Disposition: A | Discharge status: Home / Self Care | Payer: 59 | Source: Ambulatory Visit

## 2015-12-31 VITALS — BP 138/90 | HR 56 | Resp 18 | Wt 150.0 lb

## 2015-12-31 DIAGNOSIS — D271 Benign neoplasm of left ovary: Secondary | ICD-10-CM

## 2015-12-31 DIAGNOSIS — N83299 Other ovarian cyst, unspecified side: Secondary | ICD-10-CM | POA: Diagnosis not present

## 2015-12-31 DIAGNOSIS — Z1231 Encounter for screening mammogram for malignant neoplasm of breast: Secondary | ICD-10-CM | POA: Diagnosis not present

## 2015-12-31 DIAGNOSIS — D251 Intramural leiomyoma of uterus: Secondary | ICD-10-CM | POA: Diagnosis not present

## 2015-12-31 DIAGNOSIS — D259 Leiomyoma of uterus, unspecified: Secondary | ICD-10-CM | POA: Diagnosis not present

## 2015-12-31 NOTE — Progress Notes (Signed)
55 y.o. G27P0000 Married Caucasian female here for pelvic ultrasound due to uterine fibroids and for follow up of solid lesion on left ovary.  Pt is amenorrheic on micronor.  Doing well and very pleased with this medication.  Denies other symptoms.  Patient's last menstrual period was 12/02/2015.  Contraception: micronor  Findings:  UTERUS: 7.7 x 5.3 x 3.7cm (was 7.6 x 4.0 x 5.3cm in 2014) with 35 x 33 x 51mm intramural fibroid and 13 x 13 x 13 intramural fibroid EMS:2.91mm ADNEXA: Left ovary: 2.7 x 1.5 x 1.6cm with 12 x 23mm solid lesion with calcifications.  This is avascular.  Unchanged since last year.  Lesion was 1.2 x 0.9cm in 2014       Right ovary: 3.0 x 1.6 x 1.1cm CUL DE SAC: no free fluid  Discussion:  Lesion on left ovary continues to have benign appearance.  As well, fibroids are minimally enlarged.  As pt is doing well on her micronor, will just continue this and plan to start checking [place] levels in the future to determine when the micronor should stop.   I would like to have one more PUS before stopping these unless new symptoms arise.  Pt in agreement with plan.  Assessment:  Uterine fibroids Solid left ovarian mass, stable from last year, avascular.  Plan:   Follow up for AEX in January.  Plan [place] at that time to help determine when she should stop the micronor.  Will plan repeat PUS 1 year and, if findings stable, will stop watching solid finding on left ovary.  ~15 minutes spent with patient >50% of time was in face to face discussion of above.

## 2016-01-27 MED FILL — NORETHINDRONE 0.35 MG TAB: 0.35 | 84 days supply | Qty: 84 | Fill #0

## 2016-04-13 MED FILL — HEATHER TABLET: 0.35 | 84 days supply | Qty: 84 | Fill #1

## 2016-05-13 DIAGNOSIS — Z872 Personal history of diseases of the skin and subcutaneous tissue: Secondary | ICD-10-CM | POA: Diagnosis not present

## 2016-05-13 DIAGNOSIS — L821 Other seborrheic keratosis: Secondary | ICD-10-CM | POA: Diagnosis not present

## 2016-05-13 DIAGNOSIS — D485 Neoplasm of uncertain behavior of skin: Secondary | ICD-10-CM | POA: Diagnosis not present

## 2016-05-13 DIAGNOSIS — L814 Other melanin hyperpigmentation: Secondary | ICD-10-CM | POA: Diagnosis not present

## 2016-05-13 DIAGNOSIS — D1801 Hemangioma of skin and subcutaneous tissue: Secondary | ICD-10-CM | POA: Diagnosis not present

## 2016-06-11 ENCOUNTER — Ambulatory Visit (INDEPENDENT_AMBULATORY_CARE_PROVIDER_SITE_OTHER): Payer: 59 | Admitting: Physician Assistant

## 2016-06-11 VITALS — BP 118/74 | HR 82 | Temp 97.3°F | Resp 16 | Ht 63.0 in | Wt 137.0 lb

## 2016-06-11 DIAGNOSIS — E559 Vitamin D deficiency, unspecified: Secondary | ICD-10-CM | POA: Diagnosis not present

## 2016-06-11 DIAGNOSIS — Z1389 Encounter for screening for other disorder: Secondary | ICD-10-CM | POA: Diagnosis not present

## 2016-06-11 DIAGNOSIS — D270 Benign neoplasm of right ovary: Secondary | ICD-10-CM

## 2016-06-11 DIAGNOSIS — I10 Essential (primary) hypertension: Secondary | ICD-10-CM | POA: Diagnosis not present

## 2016-06-11 DIAGNOSIS — Z13220 Encounter for screening for lipoid disorders: Secondary | ICD-10-CM

## 2016-06-11 DIAGNOSIS — R7303 Prediabetes: Secondary | ICD-10-CM

## 2016-06-11 DIAGNOSIS — Z136 Encounter for screening for cardiovascular disorders: Secondary | ICD-10-CM

## 2016-06-11 DIAGNOSIS — Z86018 Personal history of other benign neoplasm: Secondary | ICD-10-CM

## 2016-06-11 DIAGNOSIS — Z0000 Encounter for general adult medical examination without abnormal findings: Secondary | ICD-10-CM | POA: Diagnosis not present

## 2016-06-11 DIAGNOSIS — D251 Intramural leiomyoma of uterus: Secondary | ICD-10-CM

## 2016-06-11 LAB — BASIC METABOLIC PANEL WITH GFR
BUN: 8 mg/dL (ref 7–25)
CHLORIDE: 103 mmol/L (ref 98–110)
CO2: 24 mmol/L (ref 20–31)
Calcium: 9.4 mg/dL (ref 8.6–10.4)
Creat: 0.9 mg/dL (ref 0.50–1.05)
GFR, Est African American: 84 mL/min (ref >=60)
GFR, Est Non African American: 73 mL/min (ref >=60)
Glucose, Bld: 79 mg/dL (ref 65–99)
POTASSIUM: 4.1 mmol/L (ref 3.5–5.3)
SODIUM: 140 mmol/L (ref 135–146)

## 2016-06-11 LAB — CBC WITH DIFFERENTIAL/PLATELET
BASOS PCT: 1 %
Basophils Absolute: 68 cells/uL (ref 0–200)
EOS ABS: 68 {cells}/uL (ref 15–500)
EOS PCT: 1 %
HCT: 41.5 % (ref 35.0–45.0)
HEMOGLOBIN: 13.4 g/dL (ref 11.7–15.5)
LYMPHS ABS: 1700 {cells}/uL (ref 850–3900)
Lymphocytes Relative: 25 %
MCH: 30.2 pg (ref 27.0–33.0)
MCHC: 32.3 g/dL (ref 32.0–36.0)
MCV: 93.7 fL (ref 80.0–100.0)
MONOS PCT: 6 %
MPV: 11.3 fL (ref 7.5–12.5)
Monocytes Absolute: 408 cells/uL (ref 200–950)
NEUTROS ABS: 4556 {cells}/uL (ref 1500–7800)
Neutrophils Relative %: 67 %
PLATELETS: 223 10*3/uL (ref 140–400)
RBC: 4.43 MIL/uL (ref 3.80–5.10)
RDW: 12.9 % (ref 11.0–15.0)
WBC: 6.8 10*3/uL (ref 3.8–10.8)

## 2016-06-11 LAB — HEPATIC FUNCTION PANEL
ALK PHOS: 60 U/L (ref 33–130)
ALT: 13 U/L (ref 6–29)
AST: 26 U/L (ref 10–35)
Albumin: 4.1 g/dL (ref 3.6–5.1)
BILIRUBIN DIRECT: 0.1 mg/dL (ref <=0.2)
BILIRUBIN TOTAL: 0.7 mg/dL (ref 0.2–1.2)
Indirect Bilirubin: 0.6 mg/dL (ref 0.2–1.2)
Total Protein: 7 g/dL (ref 6.1–8.1)

## 2016-06-11 LAB — HEMOGLOBIN A1C
HEMOGLOBIN A1C: 5.3 % (ref <5.7)
Mean Plasma Glucose: 105 mg/dL

## 2016-06-11 LAB — TSH: TSH: 1.64 mIU/L

## 2016-06-11 LAB — LIPID PANEL
CHOL/HDL RATIO: 3.1 ratio (ref <=5.0)
Cholesterol: 148 mg/dL (ref 125–200)
HDL: 47 mg/dL (ref >=46)
LDL CALC: 87 mg/dL (ref <130)
Triglycerides: 71 mg/dL (ref <150)
VLDL: 14 mg/dL (ref <30)

## 2016-06-11 LAB — MAGNESIUM: Magnesium: 1.9 mg/dL (ref 1.5–2.5)

## 2016-06-11 NOTE — Patient Instructions (Signed)

## 2016-06-11 NOTE — Progress Notes (Signed)
Complete Physical  Assessment and Plan:  Essential hypertension - CBC with Differential/Platelet - BASIC METABOLIC PANEL WITH GFR - Hepatic function panel - TSH - Magnesium - EKG 12-Lead   Ovarian benign neoplasm, right Cont follow up   H/O dysplastic nevus Follows DERM yearly  Intramural leiomyoma of uterus  Screening for diabetes mellitus - Hemoglobin A1c - Insulin, fasting  Screening for blood or protein in urine - Urinalysis, Routine w reflex microscopic (not at [facility]) - Microalbumin / creatinine urine ratio  Screening cholesterol level - Lipid panel  Routine general medical examination at a health care facility - CBC with Differential/Platelet - BASIC METABOLIC PANEL WITH GFR - Hepatic function panel - TSH - Lipid panel - Hemoglobin A1c - Magnesium - Insulin, fasting - Vit D  25 hydroxy (rtn osteoporosis monitoring) - Urinalysis, Routine w reflex microscopic (not at [facility]) - Microalbumin / creatinine urine ratio  Vitamin D deficiency - Vit D  25 hydroxy (rtn osteoporosis monitoring)   Discussed med's effects and SE's. Screening labs and tests as requested with regular follow-up as recommended.  HPI  55 y.o. female  presents for a complete physical.   Her blood pressure has been controlled at home, today their BP is BP: 118/74 She does workout, classes 2-3 x a week.  She denies chest pain, shortness of breath, dizziness.  She is not on cholesterol medication and denies myalgias. Her cholesterol is at goal. The cholesterol last visit was:  Lab Results  Component Value Date   CHOL 148 06/12/2015   HDL 47 06/12/2015   LDLCALC 85 06/12/2015   TRIG 79 06/12/2015   CHOLHDL 3.1 06/12/2015   Last A1C in the office was:  Lab Results  Component Value Date   HGBA1C 5.7 (H) 06/12/2015   Patient is on Vitamin D supplement.   Lab Results  Component Value Date   VD25OH 39 06/12/2015   She is on an mini estrogen pill and bASA. She follows with Dr. Sabra Heck  for right ovarian mass being monitored x 2014,  Will repeat US 1 year and then stop monitoring due to stability.  Had abnormal mole removed this year, follows with Dr. Redmond Pulling.   BMI is Body mass index is 24.27 kg/m., she is working on diet and exercise. Wt Readings from Last 3 Encounters:  06/11/16 137 lb (62.1 kg)  12/31/15 150 lb (68 kg)  09/03/15 151 lb (68.5 kg)      Current Medications:  Current Outpatient Prescriptions on File Prior to Visit  Medication Sig Dispense Refill  . aspirin 81 MG tablet Take 81 mg by mouth daily.    . calcium carbonate (TUMS - DOSED IN MG ELEMENTAL CALCIUM) 500 MG chewable tablet Chew 1 tablet by mouth daily. Reported on 12/31/2015    . cholecalciferol (VITAMIN D) 1000 UNITS tablet Take 1,000-2,000 Units by mouth See admin instructions. Reported on 12/31/2015    . norethindrone (MICRONOR,CAMILA,ERRIN) 0.35 MG tablet Take 1 tablet (0.35 mg total) by mouth daily. 84 tablet 4   No current facility-administered medications on file prior to visit.    Health Maintenance:   Immunization History  Administered Date(s) Administered  . Influenza-Unspecified 05/17/2016  . Td 05/14/2006  . Tdap 06/08/2013   Tetanus: 2014 Pneumovax: N/A Prevnar 13: N/A Flu vaccine: 2017 at work Zostavax: N/A  LMP: Patient's last menstrual period was 12/05/2015. Pap: 06/2014 has appt in Jan MGM: 12/2015 DEXA: N/A Colonoscopy: 08/2013 due in 10 years EGD: N/A Last Dental Exam: Dr. Lawson Radar 05/2016 Last  Eye Exam: Dr. Roxy Manns 04/2016, wears glasses Still working as rehab nurse at [employer]  Patient Care Team: Unk Pinto, MD as PCP - General (Internal Medicine) Jerene Bears, MD as Consulting Physician (Gastroenterology) Megan Salon, MD as Consulting Physician (Gynecology) Hillery Jacks, MD as Referring Physician (Dermatology)  Allergies: No Known Allergies   Medical History:  Past Medical History:  Diagnosis Date  . Anemia   . Hypertension    pt not taking  medications at this time  . STD (sexually transmitted disease) 02/91   Herpes vulvitis   SURGICAL HISTORY She  has a past surgical history that includes Laparoscopic cholecystectomy and Dilatation & currettage/hysteroscopy with resectoscope (N/A, 04/10/2013).   FAMILY HISTORY Her family history includes Diabetes in her maternal grandmother and mother; Hypertension in her brother, maternal grandfather, mother, and paternal grandfather.   SOCIAL HISTORY She  reports that she has never smoked. She has never used smokeless tobacco. She reports that she does not drink alcohol or use drugs.  Review of Systems  Constitutional: Negative.   HENT: Negative.   Eyes: Negative.   Respiratory: Negative.   Cardiovascular: Negative.   Gastrointestinal: Negative.   Genitourinary: Negative.   Musculoskeletal: Negative.   Skin: Negative.   Neurological: Negative.   Endo/Heme/Allergies: Negative.   Psychiatric/Behavioral: Negative.     Physical Exam: Estimated body mass index is 24.27 kg/m as calculated from the following:   Height as of this encounter: 5\' 3"  (1.6 m).   Weight as of this encounter: 137 lb (62.1 kg). BP 118/74   Pulse 82   Temp 97.3 F (36.3 C)   Resp 16   Ht 5\' 3"  (1.6 m)   Wt 137 lb (62.1 kg)   LMP 12/05/2015   SpO2 96%   BMI 24.27 kg/m  General Appearance: Well nourished, in no apparent distress.  Eyes: PERRLA, EOMs, conjunctiva no swelling or erythema, normal fundi and vessels.  Sinuses: No Frontal/maxillary tenderness  ENT/Mouth: Ext aud canals clear, normal light reflex with TMs without erythema, bulging. Good dentition. No erythema, swelling, or exudate on post pharynx. Tonsils not swollen or erythematous. Hearing normal.  Neck: Supple, thyroid normal. No bruits  Respiratory: Respiratory effort normal, BS equal bilaterally without rales, rhonchi, wheezing or stridor.  Cardio: RRR without murmurs, rubs or gallops. Brisk peripheral pulses without edema.  Chest:  symmetric, with normal excursions and percussion.  Breasts: defer GYN  Abdomen: Soft, nontender, no guarding, rebound, hernias, masses, or organomegaly. .  Lymphatics: Non tender without lymphadenopathy.  Genitourinary: defer GYN Musculoskeletal: Full ROM all peripheral extremities,5/5 strength, and normal gait.  Skin: Warm, dry without rashes, lesions, ecchymosis. Follows Derm yearly Neuro: Cranial nerves intact, reflexes equal bilaterally. Normal muscle tone, no cerebellar symptoms. Sensation intact.  Psych: Awake and oriented X 3, normal affect, Insight and Judgment appropriate.   EKG: WNL, no ST changes   Vicie Mutters 9:07 AM [facility]

## 2016-06-12 LAB — URINALYSIS, MICROSCOPIC ONLY
Bacteria, UA: NONE SEEN [HPF]
CRYSTALS: NONE SEEN [HPF]
Casts: NONE SEEN [LPF]
RBC / HPF: NONE SEEN RBC/HPF (ref <=2)
SQUAMOUS EPITHELIAL / LPF: NONE SEEN [HPF] (ref <=5)
WBC UA: NONE SEEN WBC/HPF (ref <=5)
Yeast: NONE SEEN [HPF]

## 2016-06-12 LAB — URINALYSIS, ROUTINE W REFLEX MICROSCOPIC
BILIRUBIN URINE: NEGATIVE
GLUCOSE, UA: NEGATIVE
KETONES UR: NEGATIVE
Leukocytes, UA: NEGATIVE
Nitrite: NEGATIVE
PH: 6 (ref 5.0–8.0)
Protein, ur: NEGATIVE
SPECIFIC GRAVITY, URINE: 1.009 (ref 1.001–1.035)

## 2016-06-12 LAB — MICROALBUMIN / CREATININE URINE RATIO
Creatinine, Urine: 102 mg/dL (ref 20–320)
Microalb Creat Ratio: 4 mcg/mg creat (ref <30)
Microalb, Ur: 0.4 mg/dL

## 2016-06-12 LAB — VITAMIN D 25 HYDROXY (VIT D DEFICIENCY, FRACTURES): Vit D, 25-Hydroxy: 33 ng/mL (ref 30–100)

## 2016-07-13 MED FILL — HEATHER TABLET: 0.35 | 84 days supply | Qty: 84 | Fill #2

## 2016-09-07 ENCOUNTER — Ambulatory Visit: Payer: 59 | Admitting: Obstetrics & Gynecology

## 2016-10-05 ENCOUNTER — Other Ambulatory Visit: Payer: Self-pay

## 2016-10-05 MED ORDER — NORETHINDRONE 0.35 MG PO TABS: 1.0000 | ORAL_TABLET | Freq: Every day | ORAL | 0 refills | Status: DC

## 2016-10-05 MED FILL — NORETHINDRONE 0.35 MG TAB: 0.35 | 84 days supply | Qty: 84 | Fill #0

## 2016-10-05 NOTE — Telephone Encounter (Signed)
Medication refill request: Heather  Last AEX:  09/03/15 SM Next AEX: 10/09/16 Last MMG (if hormonal medication request): 12/31/15 BIRADS 1 negative Refill authorized: 09/03/15 #[id number] w/4 refills; today please advise

## 2016-10-08 NOTE — Progress Notes (Signed)
56 y.o. G0P0000 Married Caucasian F here for annual exam.  Doing well.  Cycles are becoming much less frequent.  Flow was three days.  She only had three cycles last year total.  PCP:  Dr. Melford Aase  Patient's last menstrual period was 06/16/2016.          Sexually active: Yes.    The current method of family planning is OCP (estrogen/progesterone).    Exercising: Yes.    walk, yoga, aerobics Smoker:  no  Health Maintenance: Pap:  07/03/14 negative  History of abnormal Pap:  yes MMG:  12/31/15 BIRADS 1 negative  Colonoscopy:  08/21/13 normal- repeat 10 years  BMD:   never TDaP:  06/08/13 Pneumonia vaccine(s):  Not indicated  Zostavax:   Not indicated Hep C testing: 06/12/15 negative  Screening Labs: PCP did labs in October   reports that she has never smoked. She has never used smokeless tobacco. She reports that she does not drink alcohol or use drugs.  Past Medical History:  Diagnosis Date  . Anemia   . Hypertension    pt not taking medications at this time  . 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 DILATATION & CURRETTAGE/HYSTEROSCOPY WITH RESECTOCOPE N/A 04/10/2013   Procedure: DILATATION & CURETTAGE/HYSTEROSCOPY WITH RESECTOCOPE;  Surgeon: Lyman Speller, MD;  Location: [facility];  Service: Gynecology;  Laterality: N/A;  . LAPAROSCOPIC CHOLECYSTECTOMY      Current Outpatient Prescriptions  Medication Sig Dispense Refill  . aspirin 81 MG tablet Take 81 mg by mouth daily.    . calcium carbonate (TUMS - DOSED IN MG ELEMENTAL CALCIUM) 500 MG chewable tablet Chew 1 tablet by mouth daily. Reported on 12/31/2015    . cholecalciferol (VITAMIN D) 1000 UNITS tablet Take 1,000-2,000 Units by mouth See admin instructions. Reported on 12/31/2015    . norethindrone (MICRONOR,CAMILA,ERRIN) 0.35 MG tablet Take 1 tablet (0.35 mg total) by mouth daily. 84 tablet 0   No current facility-administered medications for  this visit.     Family History  Problem Relation Age of Onset  . Diabetes Maternal Grandmother   . Hypertension Maternal Grandfather   . Hypertension Paternal Grandfather   . Diabetes Mother   . Hypertension Mother   . Hypertension Brother   . Peripheral vascular disease Brother   . Obesity Brother   . Colon cancer Neg Hx   . Pancreatic cancer Neg Hx   . Stomach cancer Neg Hx     ROS:  Pertinent items are noted in HPI.  Otherwise, a comprehensive ROS was negative.  Exam:   BP 112/60 (BP Location: Right Arm, Patient Position: Sitting, Cuff Size: Normal)   Pulse 60   Resp 16   Ht 5' 2.25" (1.581 m)   Wt 128 lb (58.1 kg)   LMP 06/16/2016   BMI 23.22 kg/m   Weight change: -23# Height: 5' 2.25" (158.1 cm)  Ht Readings from Last 3 Encounters:  10/09/16 5' 2.25" (1.581 m)  06/11/16 5\' 3"  (1.6 m)  09/03/15 5' 2.5" (1.588 m)    General appearance: alert, cooperative and appears stated age Head: Normocephalic, without obvious abnormality, atraumatic Neck: no adenopathy, supple, symmetrical, trachea midline and thyroid normal to inspection and palpation Lungs: clear to auscultation bilaterally Breasts: normal appearance, no masses or tenderness Heart: regular rate and rhythm Abdomen: soft, non-tender; bowel sounds normal; no masses,  no organomegaly Extremities: extremities normal, atraumatic, no cyanosis or edema Skin:  Skin color, texture, turgor normal. No rashes or lesions Lymph nodes: Cervical, supraclavicular, and axillary nodes normal. No abnormal inguinal nodes palpated Neurologic: Grossly normal   Pelvic: External genitalia:  no lesions              Urethra:  normal appearing urethra with no masses, tenderness or lesions              Bartholins and Skenes: normal                 Vagina: normal appearing vagina with normal color and discharge, no lesions              Cervix: no lesions              Pap taken: Yes.   Bimanual Exam:  Uterus:  enlarged, 8 weeks size,  stable              Adnexa: normal adnexa and no mass, fullness, tenderness               Rectovaginal: Confirms               Anus:  normal sphincter tone, no lesions  Chaperone was present for exam.  A:  Well Woman with normal exam Intentional weight loss Solid ovarian mass and uterine fibroids H/O breast cysts and benign breast calcifications Hypertension resolved with weight loss  P:   Mammogram guidelines reviewed pap smear obtained today RF for micronor.  #90 day supply/4RF Plan follow up ultrasound in May to recheck fibroids and ovary Labs and vaccines are UTD return annually or prn

## 2016-10-09 ENCOUNTER — Encounter: Payer: Self-pay | Admitting: Obstetrics & Gynecology

## 2016-10-09 ENCOUNTER — Ambulatory Visit (INDEPENDENT_AMBULATORY_CARE_PROVIDER_SITE_OTHER): Payer: 59 | Admitting: Obstetrics & Gynecology

## 2016-10-09 VITALS — BP 112/60 | HR 60 | Resp 16 | Ht 62.25 in | Wt 128.0 lb

## 2016-10-09 DIAGNOSIS — Z01419 Encounter for gynecological examination (general) (routine) without abnormal findings: Secondary | ICD-10-CM

## 2016-10-09 DIAGNOSIS — D251 Intramural leiomyoma of uterus: Secondary | ICD-10-CM

## 2016-10-09 DIAGNOSIS — Z124 Encounter for screening for malignant neoplasm of cervix: Secondary | ICD-10-CM

## 2016-10-09 MED ORDER — NORETHINDRONE 0.35 MG PO TABS: 1.0000 | ORAL_TABLET | Freq: Every day | ORAL | 4 refills | Status: DC

## 2016-10-12 LAB — IPS PAP TEST WITH REFLEX TO HPV

## 2016-11-18 ENCOUNTER — Other Ambulatory Visit: Payer: Self-pay | Admitting: Obstetrics & Gynecology

## 2016-11-18 DIAGNOSIS — Z1231 Encounter for screening mammogram for malignant neoplasm of breast: Secondary | ICD-10-CM

## 2016-12-28 MED FILL — NORETHINDRONE 0.35 MG TAB: 0.35 | 84 days supply | Qty: 84 | Fill #0

## 2016-12-31 ENCOUNTER — Ambulatory Visit (INDEPENDENT_AMBULATORY_CARE_PROVIDER_SITE_OTHER): Payer: 59 | Admitting: Obstetrics & Gynecology

## 2016-12-31 ENCOUNTER — Ambulatory Visit
Admission: RE | Admit: 2016-12-31 | Discharge: 2016-12-31 | Disposition: A | Discharge status: Home / Self Care | Payer: 59 | Source: Ambulatory Visit | Attending: Obstetrics & Gynecology | Admitting: Obstetrics & Gynecology

## 2016-12-31 ENCOUNTER — Ambulatory Visit (INDEPENDENT_AMBULATORY_CARE_PROVIDER_SITE_OTHER): Payer: 59

## 2016-12-31 VITALS — BP 120/80 | HR 56 | Resp 14 | Ht 62.25 in | Wt 128.0 lb

## 2016-12-31 DIAGNOSIS — N839 Noninflammatory disorder of ovary, fallopian tube and broad ligament, unspecified: Secondary | ICD-10-CM | POA: Diagnosis not present

## 2016-12-31 DIAGNOSIS — D251 Intramural leiomyoma of uterus: Secondary | ICD-10-CM | POA: Diagnosis not present

## 2016-12-31 DIAGNOSIS — Z1231 Encounter for screening mammogram for malignant neoplasm of breast: Secondary | ICD-10-CM

## 2016-12-31 DIAGNOSIS — N838 Other noninflammatory disorders of ovary, fallopian tube and broad ligament: Secondary | ICD-10-CM

## 2016-12-31 NOTE — Progress Notes (Signed)
56 y.o. G63P0000 Married Caucasian female here for pelvic ultrasound.  Pt has hx of uterine fibroids and left ovarian mass with calcifications.  This has been followed, with stable findings, since 6/14.  When initially noted, it measured 22 X 14 X 26mm.    LMP:  Amenorrhea with micronor  Contraception: micronor  Findings:  UTERUS: 6.5 x 4.4 x 3.0cm with 3.5 x 2.7cm, 0.7 x 0.6cm, and 1.3 x 0.8cm intramural fibroids, no increased growth EMS: 2.79mm, symmetric ADNEXA: Left ovary: 2.4 x 1.0 x 1.4cm       Right ovary: 3.1 x 1.2 x 1.0cm with 12 x 70mm mass with calcifications, avascular CUL DE SAC: no free fluid  Discussion:  Findings reviewed and compared with prior ultrasounds.  D/W pt that next year will be five years of following this ovarian findings and feel we can stop monitoring as long as there is no other change on physical exam.  Pt is comfortable with this plan.  Assessment:  Intrmural fibroids and 10 x 46mm ovarian mass with calcifications, stable in size  Plan:  Recheck PUS 1 year.  Pt knows to call with any changes/concerns.  ~15 minutes spent with patient >50% of time was in face to face discussion of above.

## 2017-01-01 ENCOUNTER — Ambulatory Visit: Payer: 59 | Admitting: Obstetrics & Gynecology

## 2017-01-03 ENCOUNTER — Encounter: Payer: Self-pay | Admitting: Obstetrics & Gynecology

## 2017-04-12 MED FILL — NORETHINDRONE 0.35 MG TAB: 0.35 | 84 days supply | Qty: 84 | Fill #1

## 2017-04-21 DIAGNOSIS — H5201 Hypermetropia, right eye: Secondary | ICD-10-CM | POA: Diagnosis not present

## 2017-06-01 DIAGNOSIS — Z872 Personal history of diseases of the skin and subcutaneous tissue: Secondary | ICD-10-CM | POA: Diagnosis not present

## 2017-06-01 DIAGNOSIS — L821 Other seborrheic keratosis: Secondary | ICD-10-CM | POA: Diagnosis not present

## 2017-06-01 DIAGNOSIS — D225 Melanocytic nevi of trunk: Secondary | ICD-10-CM | POA: Diagnosis not present

## 2017-06-01 DIAGNOSIS — L814 Other melanin hyperpigmentation: Secondary | ICD-10-CM | POA: Diagnosis not present

## 2017-06-01 DIAGNOSIS — D1801 Hemangioma of skin and subcutaneous tissue: Secondary | ICD-10-CM | POA: Diagnosis not present

## 2017-06-15 ENCOUNTER — Encounter: Payer: Self-pay | Admitting: Physician Assistant

## 2017-06-22 NOTE — Progress Notes (Signed)
Complete Physical  Assessment and Plan:  Nancy Carter was seen today for annual exam and gout.  Diagnoses and all orders for this visit:  Encounter for routine adult health examination with abnormal findings  Essential hypertension      -       Currently well managed by lifestyle changes  Intramural leiomyoma of uterus     -       Continue f/u with GYN as recommended  Benign neoplasm of left ovary      -      Continue f/u with GYN as recommended  Screening cholesterol level -     Lipid panel  Medication management -     CBC with Differential/Platelet -     BASIC METABOLIC PANEL WITH GFR -     Hepatic function panel  Screening for cardiovascular condition -     EKG 12-Lead  Screening for deficiency anemia -     Iron,Total/Total Iron Binding Cap -     Vitamin B12  Screening for diabetes mellitus -     Hemoglobin A1c -     Insulin, random  Screening for hematuria or proteinuria -     Urinalysis w microscopic + reflex cultur  Screening for thyroid disorder -     TSH  Vitamin D deficiency -     VITAMIN D 25 Hydroxy (Vit-D Deficiency, Fractures)  H/O dysplastic nevus      -      No concerning areas today; continue follow up with derm; encouraged sunscreen use  Swelling of toe of right foot Reduced mobility without pain, with intermittent swelling and crepitus. Likely arthritis. Discussed possible ortho ref during flare for arthrocentesis. -     DG Foot 2 Views Right; Future  Bradycardia       -     Without dizziness, orthostatic symptoms; discussed with Dr. Melford Aase - patient to measure pulse response with exercise, notify of any dizziness, CP, SOB, syncopy with exercise or sudden position changes.   Discussed med's effects and SE's. Screening labs and tests as requested with regular follow-up as recommended. Over 40 minutes of exam, counseling, chart review, and complex, high level critical decision making was performed this visit.   HPI  56 y.o. Caucasian female  who works as a Patent examiner at [employer] presents for a complete physical and follow up for has Intramural leiomyoma of uterus; Ovarian benign neoplasm; Hypertension; and H/O dysplastic nevus on their problem list. She has been followed annually by Dr. Hale Bogus GYN for intramural leiomyoma of uterus and benign ovarian neoplasm since 2014; has been stable, has one more visit next January and will  She sees dermatology Dr. Redmond Pulling annually for history of dysplastic nevus. She presents today concerned about reduced mobility and intermittent swelling of R foot 1st digit MCP joint without history of fracture or known injury. She denies pain with this, it does not influence her balance or activities in any way.   BMI is Body mass index is 24.31 kg/m., she has been working on diet and exercise. Wt Readings from Last 3 Encounters:  06/23/17 134 lb (60.8 kg)  12/31/16 128 lb (58.1 kg)  10/09/16 128 lb (58.1 kg)   Her blood pressure has been controlled at home, today their BP is BP: 122/80 She does workout. She denies chest pain, shortness of breath, dizziness.   She is not on cholesterol medication and denies myalgias. Her cholesterol is at goal. The cholesterol last visit was:  Lab Results  Component Value Date   CHOL 148 06/11/2016   HDL 47 06/11/2016   LDLCALC 87 06/11/2016   TRIG 71 06/11/2016   CHOLHDL 3.1 06/11/2016   Last A1C in the office was:  Lab Results  Component Value Date   HGBA1C 5.3 06/11/2016   Last GFR: Lab Results  Component Value Date   GFRNONAA 73 06/11/2016   Patient was not on Vitamin D supplement and below goal at last visit:  Lab Results  Component Value Date   VD25OH 33 06/11/2016      Current Medications:  Current Outpatient Medications on File Prior to Visit  Medication Sig Dispense Refill  . aspirin 81 MG tablet Take 81 mg by mouth daily.    . norethindrone (MICRONOR,CAMILA,ERRIN) 0.35 MG tablet Take 1 tablet (0.35 mg total) by mouth daily. 84  tablet 4  . calcium carbonate (TUMS - DOSED IN MG ELEMENTAL CALCIUM) 500 MG chewable tablet Chew 1 tablet by mouth daily. Reported on 12/31/2015    . cholecalciferol (VITAMIN D) 1000 UNITS tablet Take 1,000-2,000 Units by mouth See admin instructions. Reported on 12/31/2015     No current facility-administered medications on file prior to visit.    Allergies:  No Known Allergies Medical History:  She has Intramural leiomyoma of uterus; Ovarian benign neoplasm; Hypertension; and H/O dysplastic nevus on their problem list. Health Maintenance:   Immunization History  Administered Date(s) Administered  . Influenza-Unspecified 05/17/2016  . Td 05/14/2006  . Tdap 06/08/2013   Tetanus: 2014 Pneumovax: N/A Prevnar 13: N/A Flu vaccine: 05/07/2017 at work Shingrix: N/A  LMP: on premarin;  Pap: 10/09/2016 MGM: 12/31/2016 DEXA: N/A Colonoscopy: 08/2013 due in 10 years EGD: N/A  Last Dental Exam: Dr. Lawson Radar 05/2016, scheduled for 08/2017 Last Eye Exam: Dr. Roxy Manns 01/2017, wears glasses Last skin exam: Annually, last 05/2017  Patient Care Team: Unk Pinto, MD as PCP - General (Internal Medicine) Pyrtle, Lajuan Lines, MD as Consulting Physician (Gastroenterology) Megan Salon, MD as Consulting Physician (Gynecology) Hillery Jacks, MD as Referring Physician (Dermatology)  Surgical History:  She has a past surgical history that includes Laparoscopic cholecystectomy and DILATATION & CURETTAGE/HYSTEROSCOPY WITH RESECTOCOPE (N/A, 04/10/2013). Family History:  Herfamily history includes Diabetes in her maternal grandmother and mother; Hypertension in her brother, maternal grandfather, mother, and paternal grandfather; Obesity in her brother; Peripheral vascular disease in her brother. Social History:  She reports that  has never smoked. she has never used smokeless tobacco. She reports that she does not drink alcohol or use drugs.  Review of Systems: Review of Systems  Constitutional: Negative  for chills, diaphoresis, fever, malaise/fatigue and weight loss.  HENT: Negative for hearing loss and tinnitus.   Eyes: Negative for blurred vision and double vision.  Respiratory: Negative for cough, shortness of breath and wheezing.   Cardiovascular: Negative for chest pain, palpitations, orthopnea, claudication and leg swelling.  Gastrointestinal: Negative for abdominal pain, blood in stool, constipation, diarrhea, heartburn, melena, nausea and vomiting.  Genitourinary: Negative.  Negative for dysuria and urgency.  Musculoskeletal: Negative for joint pain and myalgias.  Skin: Negative for rash.  Neurological: Negative for dizziness, tingling, sensory change, weakness and headaches.  Endo/Heme/Allergies: Negative for polydipsia.  Psychiatric/Behavioral: Negative.  Negative for depression, memory loss and substance abuse. The patient is not nervous/anxious and does not have insomnia.   All other systems reviewed and are negative.   Physical Exam: Estimated body mass index is 24.31 kg/m as calculated from the following:   Height as  of this encounter: 5' 2.25" (1.581 m).   Weight as of this encounter: 134 lb (60.8 kg). BP 122/80   Pulse (!) 54   Temp (!) 97.5 F (36.4 C)   Ht 5' 2.25" (1.581 m)   Wt 134 lb (60.8 kg)   SpO2 99%   BMI 24.31 kg/m  General Appearance: Well nourished, in no apparent distress.  Eyes: PERRLA, EOMs, conjunctiva no swelling or erythema, normal fundi and vessels.  Sinuses: No Frontal/maxillary tenderness  ENT/Mouth: Ext aud canals clear, normal light reflex with TMs without erythema, bulging. Good dentition. No erythema, swelling, or exudate on post pharynx. Tonsils not swollen or erythematous. Hearing normal.  Neck: Supple, thyroid normal. No bruits  Respiratory: Respiratory effort normal, BS equal bilaterally without rales, rhonchi, wheezing or stridor.  Cardio: RRR without murmurs, rubs or gallops. Brisk peripheral pulses without edema.  Chest: symmetric,  with normal excursions and percussion.  Breasts:Defer to GYN Abdomen: Soft, nontender, no guarding, rebound, hernias, masses, or organomegaly.  Lymphatics: Non tender without lymphadenopathy.  Genitourinary: Defer to GYN Musculoskeletal: Full ROM all peripheral extremities,5/5 strength, and normal gait. R foot 1st digit MTP joint with some bony enlargement with crepitus, reduced range of motion, without erythema/effusion.  Skin: Warm, dry without rashes, lesions, ecchymosis. Neuro: Cranial nerves intact, reflexes equal bilaterally. Normal muscle tone, no cerebellar symptoms. Sensation intact.  Psych: Awake and oriented X 3, normal affect, Insight and Judgment appropriate.   EKG: no ST changes, sinus bradycardia  Gorden Harms Elam Ellis 10:47 AM [facility]

## 2017-06-23 ENCOUNTER — Encounter: Payer: Self-pay | Admitting: Adult Health

## 2017-06-23 ENCOUNTER — Other Ambulatory Visit: Payer: Self-pay | Admitting: Adult Health

## 2017-06-23 ENCOUNTER — Ambulatory Visit (HOSPITAL_COMMUNITY)
Admission: RE | Admit: 2017-06-23 | Discharge: 2017-06-23 | Disposition: A | Discharge status: Home / Self Care | Payer: 59 | Source: Ambulatory Visit | Attending: Adult Health | Admitting: Adult Health

## 2017-06-23 ENCOUNTER — Ambulatory Visit (INDEPENDENT_AMBULATORY_CARE_PROVIDER_SITE_OTHER): Payer: 59 | Admitting: Adult Health

## 2017-06-23 VITALS — BP 122/80 | HR 54 | Temp 97.5°F | Ht 62.25 in | Wt 134.0 lb

## 2017-06-23 DIAGNOSIS — Z1211 Encounter for screening for malignant neoplasm of colon: Secondary | ICD-10-CM

## 2017-06-23 DIAGNOSIS — Z0000 Encounter for general adult medical examination without abnormal findings: Secondary | ICD-10-CM | POA: Diagnosis not present

## 2017-06-23 DIAGNOSIS — M19071 Primary osteoarthritis, right ankle and foot: Secondary | ICD-10-CM | POA: Insufficient documentation

## 2017-06-23 DIAGNOSIS — Z136 Encounter for screening for cardiovascular disorders: Secondary | ICD-10-CM

## 2017-06-23 DIAGNOSIS — I10 Essential (primary) hypertension: Secondary | ICD-10-CM | POA: Diagnosis not present

## 2017-06-23 DIAGNOSIS — Z0001 Encounter for general adult medical examination with abnormal findings: Secondary | ICD-10-CM

## 2017-06-23 DIAGNOSIS — M7989 Other specified soft tissue disorders: Secondary | ICD-10-CM | POA: Insufficient documentation

## 2017-06-23 DIAGNOSIS — Z79899 Other long term (current) drug therapy: Secondary | ICD-10-CM | POA: Diagnosis not present

## 2017-06-23 DIAGNOSIS — Z86018 Personal history of other benign neoplasm: Secondary | ICD-10-CM

## 2017-06-23 DIAGNOSIS — D251 Intramural leiomyoma of uterus: Secondary | ICD-10-CM

## 2017-06-23 DIAGNOSIS — R001 Bradycardia, unspecified: Secondary | ICD-10-CM | POA: Insufficient documentation

## 2017-06-23 DIAGNOSIS — E559 Vitamin D deficiency, unspecified: Secondary | ICD-10-CM | POA: Diagnosis not present

## 2017-06-23 DIAGNOSIS — Z1389 Encounter for screening for other disorder: Secondary | ICD-10-CM

## 2017-06-23 DIAGNOSIS — D271 Benign neoplasm of left ovary: Secondary | ICD-10-CM

## 2017-06-23 DIAGNOSIS — Z13220 Encounter for screening for lipoid disorders: Secondary | ICD-10-CM

## 2017-06-23 DIAGNOSIS — Z130 Encounter for screening for diseases of the blood and blood-forming organs and certain disorders involving the immune mechanism: Secondary | ICD-10-CM

## 2017-06-23 DIAGNOSIS — Z1329 Encounter for screening for other suspected endocrine disorder: Secondary | ICD-10-CM

## 2017-06-23 DIAGNOSIS — Z131 Encounter for screening for diabetes mellitus: Secondary | ICD-10-CM

## 2017-06-23 NOTE — Patient Instructions (Signed)
Preventive Care for Adults  A healthy lifestyle and preventive care can promote health and wellness. Preventive health guidelines for women include the following key practices.  A routine yearly physical is a good way to check with your health care provider about your health and preventive screening. It is a chance to share any concerns and updates on your health and to receive a thorough exam.  Visit your dentist for a routine exam and preventive care every 6 months. Brush your teeth twice a day and floss once a day. Good oral hygiene prevents tooth decay and gum disease.  The frequency of eye exams is based on your age, health, family medical history, use of contact lenses, and other factors. Follow your health care provider's recommendations for frequency of eye exams.  Eat a healthy diet. Foods like vegetables, fruits, whole grains, low-fat dairy products, and lean protein foods contain the nutrients you need without too many calories. Decrease your intake of foods high in solid fats, added sugars, and salt. Eat the right amount of calories for you.Get information about a proper diet from your health care provider, if necessary.  Regular physical exercise is one of the most important things you can do for your health. Most adults should get at least 150 minutes of moderate-intensity exercise (any activity that increases your heart rate and causes you to sweat) each week. In addition, most adults need muscle-strengthening exercises on 2 or more days a week.  Maintain a healthy weight. The body mass index (BMI) is a screening tool to identify possible weight problems. It provides an estimate of body fat based on height and weight. Your health care provider can find your BMI and can help you achieve or maintain a healthy weight.For adults 20 years and older:  A BMI below 18.5 is considered underweight.  A BMI of 18.5 to 24.9 is normal.  A BMI of 25 to 29.9 is considered overweight.  A BMI of  30 and above is considered obese.  Maintain normal blood lipids and cholesterol levels by exercising and minimizing your intake of saturated fat. Eat a balanced diet with plenty of fruit and vegetables. Blood tests for lipids and cholesterol should begin at age 20 and be repeated every 5 years. If your lipid or cholesterol levels are high, you are over 50, or you are at high risk for heart disease, you may need your cholesterol levels checked more frequently.Ongoing high lipid and cholesterol levels should be treated with medicines if diet and exercise are not working.  If you smoke, find out from your health care provider how to quit. If you do not use tobacco, do not start.  Lung cancer screening is recommended for adults aged 55-80 years who are at high risk for developing lung cancer because of a history of smoking. A yearly low-dose CT scan of the lungs is recommended for people who have at least a 30-pack-year history of smoking and are a current smoker or have quit within the past 15 years. A pack year of smoking is smoking an average of 1 pack of cigarettes a day for 1 year (for example: 1 pack a day for 30 years or 2 packs a day for 15 years). Yearly screening should continue until the smoker has stopped smoking for at least 15 years. Yearly screening should be stopped for people who develop a health problem that would prevent them from having lung cancer treatment.  High blood pressure causes heart disease and increases the risk of   stroke. Your blood pressure should be checked at least every 1 to 2 years. Ongoing high blood pressure should be treated with medicines if weight loss and exercise do not work.  If you are 55-79 years old, ask your health care provider if you should take aspirin to prevent strokes.  Diabetes screening involves taking a blood sample to check your fasting blood sugar level. This should be done once every 3 years, after age 45, if you are within normal weight and  without risk factors for diabetes. Testing should be considered at a younger age or be carried out more frequently if you are overweight and have at least 1 risk factor for diabetes.  Breast cancer screening is essential preventive care for women. You should practice "breast self-awareness." This means understanding the normal appearance and feel of your breasts and may include breast self-examination. Any changes detected, no matter how small, should be reported to a health care provider. Women in their 20s and 30s should have a clinical breast exam (CBE) by a health care provider as part of a regular health exam every 1 to 3 years. After age 40, women should have a CBE every year. Starting at age 40, women should consider having a mammogram (breast X-ray test) every year. Women who have a family history of breast cancer should talk to their health care provider about genetic screening. Women at a high risk of breast cancer should talk to their health care providers about having an MRI and a mammogram every year.  Breast cancer gene (BRCA)-related cancer risk assessment is recommended for women who have family members with BRCA-related cancers. BRCA-related cancers include breast, ovarian, tubal, and peritoneal cancers. Having family members with these cancers may be associated with an increased risk for harmful changes (mutations) in the breast cancer genes BRCA1 and BRCA2. Results of the assessment will determine the need for genetic counseling and BRCA1 and BRCA2 testing.  Routine pelvic exams to screen for cancer are no longer recommended for nonpregnant women who are considered low risk for cancer of the pelvic organs (ovaries, uterus, and vagina) and who do not have symptoms. Ask your health care provider if a screening pelvic exam is right for you.  If you have had past treatment for cervical cancer or a condition that could lead to cancer, you need Pap tests and screening for cancer for at least 20  years after your treatment. If Pap tests have been discontinued, your risk factors (such as having a new sexual partner) need to be reassessed to determine if screening should be resumed. Some women have medical problems that increase the chance of getting cervical cancer. In these cases, your health care provider may recommend more frequent screening and Pap tests.  Colorectal cancer can be detected and often prevented. Most routine colorectal cancer screening begins at the age of 50 years and continues through age 75 years. However, your health care provider may recommend screening at an earlier age if you have risk factors for colon cancer. On a yearly basis, your health care provider may provide home test kits to check for hidden blood in the stool. Use of a small camera at the end of a tube, to directly examine the colon (sigmoidoscopy or colonoscopy), can detect the earliest forms of colorectal cancer. Talk to your health care provider about this at age 50, when routine screening begins. Direct exam of the colon should be repeated every 5-10 years through age 75 years, unless early forms of pre-cancerous   polyps or small growths are found.  Hepatitis C blood testing is recommended for all people born from 1945 through 1965 and any individual with known risks for hepatitis C.  Pra  Osteoporosis is a disease in which the bones lose minerals and strength with aging. This can result in serious bone fractures or breaks. The risk of osteoporosis can be identified using a bone density scan. Women ages 65 years and over and women at risk for fractures or osteoporosis should discuss screening with their health care providers. Ask your health care provider whether you should take a calcium supplement or vitamin D to reduce the rate of osteoporosis.  Menopause can be associated with physical symptoms and risks. Hormone replacement therapy is available to decrease symptoms and risks. You should talk to your  health care provider about whether hormone replacement therapy is right for you.  Use sunscreen. Apply sunscreen liberally and repeatedly throughout the day. You should seek shade when your shadow is shorter than you. Protect yourself by wearing long sleeves, pants, a wide-brimmed hat, and sunglasses year round, whenever you are outdoors.  Once a month, do a whole body skin exam, using a mirror to look at the skin on your back. Tell your health care provider of new moles, moles that have irregular borders, moles that are larger than a pencil eraser, or moles that have changed in shape or color.  Stay current with required vaccines (immunizations).  Influenza vaccine. All adults should be immunized every year.  Tetanus, diphtheria, and acellular pertussis (Td, Tdap) vaccine. Pregnant women should receive 1 dose of Tdap vaccine during each pregnancy. The dose should be obtained regardless of the length of time since the last dose. Immunization is preferred during the 27th-36th week of gestation. An adult who has not previously received Tdap or who does not know her vaccine status should receive 1 dose of Tdap. This initial dose should be followed by tetanus and diphtheria toxoids (Td) booster doses every 10 years. Adults with an unknown or incomplete history of completing a 3-dose immunization series with Td-containing vaccines should begin or complete a primary immunization series including a Tdap dose. Adults should receive a Td booster every 10 years.  Varicella vaccine. An adult without evidence of immunity to varicella should receive 2 doses or a second dose if she has previously received 1 dose. Pregnant females who do not have evidence of immunity should receive the first dose after pregnancy. This first dose should be obtained before leaving the health care facility. The second dose should be obtained 4-8 weeks after the first dose.  Human papillomavirus (HPV) vaccine. Females aged 13-26 years  who have not received the vaccine previously should obtain the 3-dose series. The vaccine is not recommended for use in pregnant females. However, pregnancy testing is not needed before receiving a dose. If a female is found to be pregnant after receiving a dose, no treatment is needed. In that case, the remaining doses should be delayed until after the pregnancy. Immunization is recommended for any person with an immunocompromised condition through the age of 26 years if she did not get any or all doses earlier. During the 3-dose series, the second dose should be obtained 4-8 weeks after the first dose. The third dose should be obtained 24 weeks after the first dose and 16 weeks after the second dose.  Zoster vaccine. One dose is recommended for adults aged 60 years or older unless certain conditions are present.  Measles, mumps, and rubella (  MMR) vaccine. Adults born before [birth year] generally are considered immune to measles and mumps. Adults born in [birth year] or later should have 1 or more doses of MMR vaccine unless there is a contraindication to the vaccine or there is laboratory evidence of immunity to each of the three diseases. A routine second dose of MMR vaccine should be obtained at least 28 days after the first dose for students attending postsecondary schools, health care workers, or international travelers. People who received inactivated measles vaccine or an unknown type of measles vaccine during 1963-1967 should receive 2 doses of MMR vaccine. People who received inactivated mumps vaccine or an unknown type of mumps vaccine before 1979 and are at high risk for mumps infection should consider immunization with 2 doses of MMR vaccine. For females of childbearing age, rubella immunity should be determined. If there is no evidence of immunity, females who are not pregnant should be vaccinated. If there is no evidence of immunity, females who are pregnant should delay immunization until after pregnancy.  Unvaccinated health care workers born before [birth year] who lack laboratory evidence of measles, mumps, or rubella immunity or laboratory confirmation of disease should consider measles and mumps immunization with 2 doses of MMR vaccine or rubella immunization with 1 dose of MMR vaccine.  Pneumococcal 13-valent conjugate (PCV13) vaccine. When indicated, a person who is uncertain of her immunization history and has no record of immunization should receive the PCV13 vaccine. An adult aged 39 years or older who has certain medical conditions and has not been previously immunized should receive 1 dose of PCV13 vaccine. This PCV13 should be followed with a dose of pneumococcal polysaccharide (PPSV23) vaccine. The PPSV23 vaccine dose should be obtained at least 8 weeks after the dose of PCV13 vaccine. An adult aged 62 years or older who has certain medical conditions and previously received 1 or more doses of PPSV23 vaccine should receive 1 dose of PCV13. The PCV13 vaccine dose should be obtained 1 or more years after the last PPSV23 vaccine dose.    Pneumococcal polysaccharide (PPSV23) vaccine. When PCV13 is also indicated, PCV13 should be obtained first. All adults aged 67 years and older should be immunized. An adult younger than age 45 years who has certain medical conditions should be immunized. Any person who resides in a nursing home or long-term care facility should be immunized. An adult smoker should be immunized. People with an immunocompromised condition and certain other conditions should receive both PCV13 and PPSV23 vaccines. People with human immunodeficiency virus (HIV) infection should be immunized as soon as possible after diagnosis. Immunization during chemotherapy or radiation therapy should be avoided. Routine use of PPSV23 vaccine is not recommended for American Indians, [place] Natives, or people younger than 65 years unless there are medical conditions that require PPSV23 vaccine. When indicated,  people who have unknown immunization and have no record of immunization should receive PPSV23 vaccine. One-time revaccination 5 years after the first dose of PPSV23 is recommended for people aged 19-64 years who have chronic kidney failure, nephrotic syndrome, asplenia, or immunocompromised conditions. People who received 1-2 doses of PPSV23 before age 23 years should receive another dose of PPSV23 vaccine at age 35 years or later if at least 5 years have passed since the previous dose. Doses of PPSV23 are not needed for people immunized with PPSV23 at or after age 38 years.  Preventive Services / Frequency   Ages 43 to 86 years  Blood pressure check.  Lipid and cholesterol check.  Lung  cancer screening. / Every year if you are aged 63-80 years and have a 30-pack-year history of smoking and currently smoke or have quit within the past 15 years. Yearly screening is stopped once you have quit smoking for at least 15 years or develop a health problem that would prevent you from having lung cancer treatment.  Clinical breast exam.** / Every year after age 28 years.  BRCA-related cancer risk assessment.** / For women who have family members with a BRCA-related cancer (breast, ovarian, tubal, or peritoneal cancers).  Mammogram.** / Every year beginning at age 37 years and continuing for as long as you are in good health. Consult with your health care provider.  Pap test.** / Every 3 years starting at age 67 years through age 4 or 2 years with a history of 3 consecutive normal Pap tests.  HPV screening.** / Every 3 years from ages 39 years through ages 38 to 63 years with a history of 3 consecutive normal Pap tests.  Fecal occult blood test (FOBT) of stool. / Every year beginning at age 50 years and continuing until age 19 years. You may not need to do this test if you get a colonoscopy every 10 years.  Flexible sigmoidoscopy or colonoscopy.** / Every 5 years for a flexible sigmoidoscopy or  every 10 years for a colonoscopy beginning at age 51 years and continuing until age 21 years.  Hepatitis C blood test.** / For all people born from [birth year] through 1965 and any individual with known risks for hepatitis C.  Skin self-exam. / Monthly.  Influenza vaccine. / Every year.  Tetanus, diphtheria, and acellular pertussis (Tdap/Td) vaccine.** / Consult your health care provider. Pregnant women should receive 1 dose of Tdap vaccine during each pregnancy. 1 dose of Td every 10 years.  Varicella vaccine.** / Consult your health care provider. Pregnant females who do not have evidence of immunity should receive the first dose after pregnancy.  Zoster vaccine.** / 1 dose for adults aged 14 years or older.  Pneumococcal 13-valent conjugate (PCV13) vaccine.** / Consult your health care provider.  Pneumococcal polysaccharide (PPSV23) vaccine.** / 1 to 2 doses if you smoke cigarettes or if you have certain conditions.  Meningococcal vaccine.** / Consult your health care provider.  Hepatitis A vaccine.** / Consult your health care provider.  Hepatitis B vaccine.** / Consult your health care provider. Screening for abdominal aortic aneurysm (AAA)  by ultrasound is recommended for people over 50 who have history of high blood pressure or who are current or former smokers.

## 2017-06-24 ENCOUNTER — Other Ambulatory Visit: Payer: Self-pay | Admitting: Adult Health

## 2017-06-24 DIAGNOSIS — R8290 Unspecified abnormal findings in urine: Secondary | ICD-10-CM

## 2017-06-24 LAB — CBC WITH DIFFERENTIAL/PLATELET
BASOS ABS: 69 {cells}/uL (ref 0–200)
Basophils Relative: 1.1 %
EOS ABS: 151 {cells}/uL (ref 15–500)
Eosinophils Relative: 2.4 %
HEMATOCRIT: 40.9 % (ref 35.0–45.0)
Hemoglobin: 13.6 g/dL (ref 11.7–15.5)
LYMPHS ABS: 1714 {cells}/uL (ref 850–3900)
MCH: 30.5 pg (ref 27.0–33.0)
MCHC: 33.3 g/dL (ref 32.0–36.0)
MCV: 91.7 fL (ref 80.0–100.0)
MPV: 11.5 fL (ref 7.5–12.5)
Monocytes Relative: 5.4 %
Neutro Abs: 4026 cells/uL (ref 1500–7800)
Neutrophils Relative %: 63.9 %
PLATELETS: 228 10*3/uL (ref 140–400)
RBC: 4.46 10*6/uL (ref 3.80–5.10)
RDW: 11.7 % (ref 11.0–15.0)
TOTAL LYMPHOCYTE: 27.2 %
WBC: 6.3 10*3/uL (ref 3.8–10.8)
WBCMIX: 340 {cells}/uL (ref 200–950)

## 2017-06-24 LAB — TEST AUTHORIZATION

## 2017-06-24 LAB — BASIC METABOLIC PANEL WITH GFR
BUN: 9 mg/dL (ref 7–25)
CALCIUM: 9.8 mg/dL (ref 8.6–10.4)
CHLORIDE: 104 mmol/L (ref 98–110)
CO2: 30 mmol/L (ref 20–32)
Creat: 0.93 mg/dL (ref 0.50–1.05)
GFR, EST AFRICAN AMERICAN: 81 mL/min/{1.73_m2} (ref >=60)
GFR, EST NON AFRICAN AMERICAN: 70 mL/min/{1.73_m2} (ref >=60)
Glucose, Bld: 76 mg/dL (ref 65–99)
POTASSIUM: 4.8 mmol/L (ref 3.5–5.3)
Sodium: 139 mmol/L (ref 135–146)

## 2017-06-24 LAB — LIPID PANEL
Cholesterol: 153 mg/dL (ref <200)
HDL: 56 mg/dL (ref >50)
LDL CHOLESTEROL (CALC): 83 mg/dL
NON-HDL CHOLESTEROL (CALC): 97 mg/dL (ref <130)
TRIGLYCERIDES: 68 mg/dL (ref <150)
Total CHOL/HDL Ratio: 2.7 (calc) (ref <5.0)

## 2017-06-24 LAB — URINALYSIS W MICROSCOPIC + REFLEX CULTURE
BILIRUBIN URINE: NEGATIVE
Bacteria, UA: NONE SEEN /HPF
Glucose, UA: NEGATIVE
HGB URINE DIPSTICK: NEGATIVE
Hyaline Cast: NONE SEEN /LPF
KETONES UR: NEGATIVE
LEUKOCYTE ESTERASE: NEGATIVE
NITRITES URINE, INITIAL: NEGATIVE
PROTEIN: NEGATIVE
RBC / HPF: NONE SEEN /HPF (ref 0–2)
Specific Gravity, Urine: 1.005 (ref 1.001–1.03)
Squamous Epithelial / LPF: NONE SEEN /HPF (ref <=5)
WBC, UA: NONE SEEN /HPF (ref 0–5)
pH: >=8.5 — AB (ref 5.0–8.0)

## 2017-06-24 LAB — HEPATIC FUNCTION PANEL
AG Ratio: 1.5 (calc) (ref 1.0–2.5)
ALBUMIN MSPROF: 4.3 g/dL (ref 3.6–5.1)
ALT: 11 U/L (ref 6–29)
AST: 17 U/L (ref 10–35)
Alkaline phosphatase (APISO): 68 U/L (ref 33–130)
BILIRUBIN DIRECT: 0.1 mg/dL (ref 0.0–0.2)
BILIRUBIN INDIRECT: 0.5 mg/dL (ref 0.2–1.2)
BILIRUBIN TOTAL: 0.6 mg/dL (ref 0.2–1.2)
GLOBULIN: 2.9 g/dL (ref 1.9–3.7)
Total Protein: 7.2 g/dL (ref 6.1–8.1)

## 2017-06-24 LAB — VITAMIN D 25 HYDROXY (VIT D DEFICIENCY, FRACTURES): Vit D, 25-Hydroxy: 35 ng/mL (ref 30–100)

## 2017-06-24 LAB — INSULIN, RANDOM: Insulin: 3 u[IU]/mL (ref 2.0–19.6)

## 2017-06-24 LAB — TSH: TSH: 1.35 m[IU]/L

## 2017-06-24 LAB — IRON, TOTAL/TOTAL IRON BINDING CAP
%SAT: 29 % (calc) (ref 11–50)
IRON: 100 ug/dL (ref 45–160)
TIBC: 345 ug/dL (ref 250–450)

## 2017-06-24 LAB — VITAMIN B12: VITAMIN B 12: 493 pg/mL (ref 200–1100)

## 2017-06-24 LAB — HEMOGLOBIN A1C
Hgb A1c MFr Bld: 5.2 % of total Hgb (ref <5.7)
Mean Plasma Glucose: 103 (calc)
eAG (mmol/L): 5.7 (calc)

## 2017-06-24 LAB — URIC ACID: Uric Acid, Serum: 3.7 mg/dL (ref 2.5–7.0)

## 2017-06-24 LAB — NO CULTURE INDICATED

## 2017-07-14 ENCOUNTER — Other Ambulatory Visit: Payer: Self-pay

## 2017-07-14 DIAGNOSIS — Z1211 Encounter for screening for malignant neoplasm of colon: Secondary | ICD-10-CM | POA: Diagnosis not present

## 2017-07-14 LAB — POC HEMOCCULT BLD/STL (HOME/3-CARD/SCREEN): FECAL OCCULT BLD: NEGATIVE

## 2017-07-14 NOTE — Addendum Note (Signed)
Addended by: Chancy Hurter on: 07/14/2017 12:18 PM   Modules accepted: Orders

## 2017-07-19 ENCOUNTER — Other Ambulatory Visit: Payer: 59

## 2017-07-19 DIAGNOSIS — R8290 Unspecified abnormal findings in urine: Secondary | ICD-10-CM | POA: Diagnosis not present

## 2017-07-20 LAB — URINALYSIS, COMPLETE
Bacteria, UA: NONE SEEN /HPF
Bilirubin Urine: NEGATIVE
GLUCOSE, UA: NEGATIVE
HYALINE CAST: NONE SEEN /LPF
Hgb urine dipstick: NEGATIVE
Ketones, ur: NEGATIVE
Leukocytes, UA: NEGATIVE
NITRITE: NEGATIVE
PROTEIN: NEGATIVE
RBC / HPF: NONE SEEN /HPF (ref 0–2)
SPECIFIC GRAVITY, URINE: 1.003 (ref 1.001–1.03)
Squamous Epithelial / LPF: NONE SEEN /HPF (ref <=5)
WBC, UA: NONE SEEN /HPF (ref 0–5)

## 2017-07-20 MED FILL — NORETHINDRONE 0.35 MG TAB: 0.35 | 84 days supply | Qty: 84 | Fill #2

## 2017-10-11 ENCOUNTER — Other Ambulatory Visit (HOSPITAL_COMMUNITY)
Admission: RE | Admit: 2017-10-11 | Discharge: 2017-10-11 | Disposition: A | Discharge status: Home / Self Care | Payer: No Typology Code available for payment source | Source: Ambulatory Visit | Attending: Obstetrics & Gynecology | Admitting: Obstetrics & Gynecology

## 2017-10-11 ENCOUNTER — Other Ambulatory Visit: Payer: Self-pay

## 2017-10-11 ENCOUNTER — Encounter: Payer: Self-pay | Admitting: Obstetrics & Gynecology

## 2017-10-11 ENCOUNTER — Ambulatory Visit (INDEPENDENT_AMBULATORY_CARE_PROVIDER_SITE_OTHER): Payer: No Typology Code available for payment source | Admitting: Obstetrics & Gynecology

## 2017-10-11 VITALS — BP 136/88 | HR 64 | Resp 16 | Ht 62.25 in | Wt 138.0 lb

## 2017-10-11 DIAGNOSIS — D251 Intramural leiomyoma of uterus: Secondary | ICD-10-CM | POA: Diagnosis not present

## 2017-10-11 DIAGNOSIS — Z124 Encounter for screening for malignant neoplasm of cervix: Secondary | ICD-10-CM | POA: Insufficient documentation

## 2017-10-11 DIAGNOSIS — Z01419 Encounter for gynecological examination (general) (routine) without abnormal findings: Secondary | ICD-10-CM | POA: Diagnosis not present

## 2017-10-11 DIAGNOSIS — N912 Amenorrhea, unspecified: Secondary | ICD-10-CM | POA: Diagnosis not present

## 2017-10-11 NOTE — Progress Notes (Signed)
Patient scheduled while in office for PUS on 01/06/18 at 1:30pm, consult to follow at 2pm with Dr. Sabra Heck. Patient verbalizes understanding and is agreeable.

## 2017-10-11 NOTE — Progress Notes (Signed)
57 y.o. G0P0000 MarriedCaucasianF here for annual exam.  Doing well.  No vaginal bleeding.  Did get a flu shot.   Amenorrhea with OCPs.  On Mircronor.  Needs testing for menopause today.  Does see dermatology yearly.  Last appt was 9/18   Patient's last menstrual period was 05/17/2016 (approximate).          Sexually active: Yes.    The current method of family planning is OCP (estrogen/progesterone).    Exercising: Yes.    walking Smoker:  no  Health Maintenance: Pap:  10/09/16 Neg   07/03/14 Neg  History of abnormal Pap:  no MMG:  12/31/16 BIRADS1:Neg  Colonoscopy:  08/21/13 Normal. F/u 10 years  BMD:   Never TDaP:  2014 Pneumonia vaccine(s):  n/a Shingrix:   No Hep C testing: 06/12/15 Neg  Screening Labs: PCP   reports that  has never smoked. she has never used smokeless tobacco. She reports that she does not drink alcohol or use drugs.  Past Medical History:  Diagnosis Date  . Anemia   . Hypertension    pt not taking medications at this time  . 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 DILATATION & CURRETTAGE/HYSTEROSCOPY WITH RESECTOCOPE N/A 04/10/2013   Procedure: DILATATION & CURETTAGE/HYSTEROSCOPY WITH RESECTOCOPE;  Surgeon: Lyman Speller, MD;  Location: [facility];  Service: Gynecology;  Laterality: N/A;  . LAPAROSCOPIC CHOLECYSTECTOMY      Current Outpatient Medications  Medication Sig Dispense Refill  . aspirin 81 MG tablet Take 81 mg by mouth daily.    . calcium carbonate (TUMS - DOSED IN MG ELEMENTAL CALCIUM) 500 MG chewable tablet Chew 1 tablet by mouth daily. Reported on 12/31/2015    . cholecalciferol (VITAMIN D) 1000 UNITS tablet Take 1,000-2,000 Units by mouth See admin instructions. Reported on 12/31/2015    . norethindrone (MICRONOR,CAMILA,ERRIN) 0.35 MG tablet Take 1 tablet (0.35 mg total) by mouth daily. 84 tablet 4   No current facility-administered medications for this visit.      Family History  Problem Relation Age of Onset  . Diabetes Maternal Grandmother   . Hypertension Maternal Grandfather   . Hypertension Paternal Grandfather   . Diabetes Mother   . Hypertension Mother   . Hypertension Brother   . Peripheral vascular disease Brother   . Obesity Brother   . Colon cancer Neg Hx   . Pancreatic cancer Neg Hx   . Stomach cancer Neg Hx   . Breast cancer Neg Hx     ROS:  Pertinent items are noted in HPI.  Otherwise, a comprehensive ROS was negative.  Exam:   BP 136/88 (BP Location: Right Arm, Patient Position: Sitting, Cuff Size: Normal)   Pulse 64   Resp 16   Ht 5' 2.25" (1.581 m)   Wt 138 lb (62.6 kg)   LMP 05/17/2016 (Approximate)   BMI 25.04 kg/m   Weight change: +4#   Height: 5' 2.25" (158.1 cm)  Ht Readings from Last 3 Encounters:  10/11/17 5' 2.25" (1.581 m)  06/23/17 5' 2.25" (1.581 m)  12/31/16 5' 2.25" (1.581 m)    General appearance: alert, cooperative and appears stated age Head: Normocephalic, without obvious abnormality, atraumatic Neck: no adenopathy, supple, symmetrical, trachea midline and thyroid normal to inspection and palpation Lungs: clear to auscultation bilaterally Breasts: normal appearance, no masses or tenderness Heart: regular rate and rhythm Abdomen: soft, non-tender; bowel sounds normal; no masses,  no organomegaly Extremities: extremities normal, atraumatic, no cyanosis or edema Skin: Skin color, texture, turgor normal. No rashes or lesions Lymph nodes: Cervical, supraclavicular, and axillary nodes normal. No abnormal inguinal nodes palpated Neurologic: Grossly normal   Pelvic: External genitalia:  no lesions              Urethra:  normal appearing urethra with no masses, tenderness or lesions              Bartholins and Skenes: normal                 Vagina: normal appearing vagina with normal color and discharge, no lesions              Cervix: no lesions              Pap taken: Yes.   Bimanual Exam:   Uterus:  normal size, contour, position, consistency, mobility, non-tender              Adnexa: no mass, fullness, tenderness               Rectovaginal: Confirms               Anus:  normal sphincter tone, no lesions, hemorrhoids noted  Chaperone was present for exam.  A:  Well Woman with normal exam H/O Solid ovarian mass and uterine fibroids, following with ultrasound H/O breast cysts and breast calcifications (benign) Amenorrhea with POPs  P:   Mammogram guidelines reviewed.  Doing 3D. pap smear and HR HPV obtained today [place] obtained today.  If elevated, will stop POPs.  If not in menopausal range, will need to RF Rx. Other lab work and vaccines UTD. return annually or prn

## 2017-10-12 ENCOUNTER — Telehealth: Payer: Self-pay | Admitting: *Deleted

## 2017-10-12 LAB — FOLLICLE STIMULATING HORMONE: FSH: 98.6 m[IU]/mL

## 2017-10-12 NOTE — Telephone Encounter (Signed)
Notes recorded by Burnice Logan, RN on 10/12/2017 at 4:25 PM EST Left message to call Sharee Pimple at [phone].

## 2017-10-12 NOTE — Telephone Encounter (Signed)
-----   Message from Megan Salon, MD sent at 10/12/2017 10:59 AM EST ----- Please let pt know her Nancy Carter was 89.  She is now in menopausal range and can stop POPs.  She needs to call if has any future bleeding.  Thanks.

## 2017-10-13 LAB — CYTOLOGY - PAP
Diagnosis: NEGATIVE
HPV (WINDOPATH): NOT DETECTED

## 2017-10-14 NOTE — Telephone Encounter (Signed)
Patient returned call to nurse Jill. °

## 2017-10-14 NOTE — Telephone Encounter (Signed)
Spoke with patient, advised as seen below per Dr. Sabra Heck. Patient verbalizes understanding and is agreeable. Will close encounter.

## 2018-01-04 ENCOUNTER — Telehealth: Payer: Self-pay | Admitting: Obstetrics & Gynecology

## 2018-01-04 NOTE — Telephone Encounter (Signed)
Call placed to patient to review benefits for scheduled ultrasound appointment. Left voicemail message requesting a return call °

## 2018-01-06 ENCOUNTER — Ambulatory Visit (INDEPENDENT_AMBULATORY_CARE_PROVIDER_SITE_OTHER): Payer: No Typology Code available for payment source

## 2018-01-06 ENCOUNTER — Ambulatory Visit: Payer: No Typology Code available for payment source | Admitting: Obstetrics & Gynecology

## 2018-01-06 VITALS — BP 134/96 | HR 60 | Resp 14 | Ht 62.25 in | Wt 139.0 lb

## 2018-01-06 DIAGNOSIS — D251 Intramural leiomyoma of uterus: Secondary | ICD-10-CM | POA: Diagnosis not present

## 2018-01-06 DIAGNOSIS — D4959 Neoplasm of unspecified behavior of other genitourinary organ: Secondary | ICD-10-CM

## 2018-01-06 NOTE — Progress Notes (Signed)
57 y.o. G60P0000 Married Caucasian female here for pelvic ultrasound due to h/o left ovarian mass with calcifications that has been present and followed for about five years.  Has been stable during this time.  Pt also with know uterine fibroids.  Denies any new symptoms--bleeding, pelvic pain.  She is was on micronor but [facility] 2/19 was 98 so stopped.    LMP:  PMP with [place] 98 2/19  Findings:  UTERUS:  6.5 x 4.2 x 3.0cm with several fibroids 3.1 x 2.8cm, 0.8 x 0.8cm, and 0.8 x 0.8cm.  Fibroids stable since prior image. EMS: 2.89mm ADNEXA: Left ovary: 2.0 x 0.8 x 1.1cm       Right ovary:  3.1 x 1.3 x 1.0cm with cystic masses x 2 seen.  This is a new change from previous ultrasound.  One mesured 16 x 82mm and one measures 2mm.  The 16 x 96mm cystic mass is a new finding. CUL DE SAC: no free fluid  Discussion:  Findings reviewed with pt.  As there is a new finding to the ovarian mass, I recommend repeating ca-125.  If this is normal, may need to consider surgical excision as she is now PMP and this is changing.  Will wait until ca-125 is back.  Assessment:  Adnexal mass that now has two cysti masses to it instead of one Fibroids, stable  Plan:  Ca-125 pending.  Will call with results.  May need to consider excision of ovary.    ~15 minutes spent with patient >50% of time was in face to face discussion of above.

## 2018-01-07 ENCOUNTER — Other Ambulatory Visit: Payer: Self-pay | Admitting: Physical Medicine and Rehabilitation

## 2018-01-07 DIAGNOSIS — Z1231 Encounter for screening mammogram for malignant neoplasm of breast: Secondary | ICD-10-CM

## 2018-01-07 LAB — CA 125: Cancer Antigen (CA) 125: 10.6 U/mL (ref 0.0–38.1)

## 2018-01-09 ENCOUNTER — Encounter: Payer: Self-pay | Admitting: Obstetrics & Gynecology

## 2018-02-11 ENCOUNTER — Ambulatory Visit
Admission: RE | Admit: 2018-02-11 | Discharge: 2018-02-11 | Disposition: A | Discharge status: Home / Self Care | Payer: No Typology Code available for payment source | Source: Ambulatory Visit | Attending: Physical Medicine and Rehabilitation | Admitting: Physical Medicine and Rehabilitation

## 2018-02-11 ENCOUNTER — Other Ambulatory Visit: Payer: Self-pay | Admitting: Obstetrics & Gynecology

## 2018-02-11 DIAGNOSIS — Z1231 Encounter for screening mammogram for malignant neoplasm of breast: Secondary | ICD-10-CM

## 2018-02-23 ENCOUNTER — Telehealth: Payer: Self-pay | Admitting: Obstetrics & Gynecology

## 2018-02-23 NOTE — Telephone Encounter (Signed)
Patient has questions regarding surgery that was discussed with Dr. Sabra Heck to remove an ovary and a cyst on an ovary.

## 2018-02-23 NOTE — Telephone Encounter (Signed)
Return call to patient. Interested in surgery to remove ovarian cyst.  Has questions about scheduling and recovery. Brief review and explanation provided. Estimate 2 week recovery.  Patient planning for end of August to mid-September surgery date. Date options reviewed. Patient will await benefits call from business office and determine final date.   Please confirm surgical procedure. Patient anticipating ovarian cystectomy.

## 2018-02-25 NOTE — Telephone Encounter (Signed)
Chart to precert. Encounter closed.   CC: Nancy Carter.

## 2018-02-25 NOTE — Telephone Encounter (Signed)
I will discuss this with her when she comes for her pre-op.  Ok to post as ovarian cystectomy but may need oophorectomy and we will review the pro's/con's of one versus the other.

## 2018-03-01 ENCOUNTER — Telehealth: Payer: Self-pay | Admitting: Obstetrics & Gynecology

## 2018-03-01 NOTE — Telephone Encounter (Signed)
Call placed to patient to review benefits for a recommended surgery. Left voicemail message requesting a return call. ° ° cc: Sally Yeakley, RN °

## 2018-03-03 NOTE — Telephone Encounter (Signed)
Second call placed to patient to review benefits for recommended surgery. Left voicemail message requesting a return call   cc: Lamont Snowball, RN

## 2018-03-03 NOTE — Telephone Encounter (Signed)
Patient returned call.  Spoke with patient regarding benefit for surgery. Patient understood and agreeable. Patient has confirmed and is ready to schedule. Patient aware this is professional benefit only. Patient aware will be contacted by hospital for separate benefits. Forwarding to Conservation officer, historic buildings for scheduling    Routing to Lamont Snowball, RN

## 2018-03-07 NOTE — Telephone Encounter (Signed)
Call to patient. Per ROI, can leave message on home and cell numbers. Both numbers have confirmations of "Lyn on [employer]. Left message calling to assist with scheduling and to call back at her convenience.

## 2018-03-31 NOTE — Telephone Encounter (Signed)
Left message to call Kaitlyn at [phone]. 

## 2018-03-31 NOTE — Telephone Encounter (Signed)
Spoke with patient. Reviewed surgical information form. Pre op appointment scheduled for September 10th at 4 pm and a 2 week post op on October 18th at 4 pm. Patient is agreeable to both appointment dates and times. Patient verbalizes understanding. Surgical information form sent to patient's verified home address on file and surgical packet to Reina's desk.  Routing to provider for final review. Patient agreeable to disposition. Will close encounter.

## 2018-04-25 DIAGNOSIS — Z0289 Encounter for other administrative examinations: Secondary | ICD-10-CM

## 2018-04-26 ENCOUNTER — Encounter: Payer: Self-pay | Admitting: Obstetrics & Gynecology

## 2018-04-26 ENCOUNTER — Other Ambulatory Visit: Payer: Self-pay

## 2018-04-26 ENCOUNTER — Ambulatory Visit (INDEPENDENT_AMBULATORY_CARE_PROVIDER_SITE_OTHER): Payer: No Typology Code available for payment source | Admitting: Obstetrics & Gynecology

## 2018-04-26 VITALS — BP 140/98 | HR 56 | Resp 16 | Ht 62.25 in | Wt 139.4 lb

## 2018-04-26 DIAGNOSIS — D4959 Neoplasm of unspecified behavior of other genitourinary organ: Secondary | ICD-10-CM

## 2018-04-26 DIAGNOSIS — D251 Intramural leiomyoma of uterus: Secondary | ICD-10-CM

## 2018-04-26 NOTE — Progress Notes (Signed)
57 y.o. G0P0000 MarriedCaucasian female here for discussion of upcoming procedure.  Laparoscopic right ovarian cystectomy/mass removal versus RSO has been discussed due to a solid mass that is been present and followed without change for several years.  Earlier this year with follow-up ultrasound this mass sounds appears different with cystic findings well.  Ca125 at that time was 10.6.  Patient is now also postmenopausal with no bleeding for the last 2 years.  That ultrasound was Jan 06, 2018.  Uterus measured 6.5 x 4.2 x 3.0 cm with several fibroids.  The largest measures 3.1 x 2.8 cm.  The other 2 are less than 1 cm.  Endometrium measured 2.5 mm.  Left ovary is normal.  The right ovary was 3.1 x 1.0 x 0 series with cystic masses x2 seen this was a change from previous ultrasound.  One measured 16 x 14 mm and the other measures 12 mm.  There is also been a 10 x 12 mm mass with calcifications on the right ovary.  We discussed today procedure.  Ovarian cancer risk reduction also reviewed if left salpingectomy was also performed.  The patient like to proceed with this as well.  Procedure discussed with patient.  Hospital stay, recovery and pain management all discussed.  Risks discussed including but not limited to bleeding, transfusion risk, infection, risk of bowel/bladder/ureteral/vascular injury discussed as well as possible need for additional surgery if injury does occur discussed.  DVT/PE and rare risk of death discussed.  Incision locations discussed.  Patient aware if pathology abnormal she may need additional treatment.  All questions answered.    Ob Hx:   Patient's last menstrual period was 05/17/2016 (approximate).          Sexually active: Yes.   Birth control: post menopausal   Last pap: 10/11/17 Neg. HR HPV:neg  Last MMG: 02/11/18 BIRADS1:Neg  Tobacco: No  Past Surgical History:  Procedure Laterality Date  . DILATATION & CURRETTAGE/HYSTEROSCOPY WITH RESECTOCOPE N/A 04/10/2013   Procedure:  DILATATION & CURETTAGE/HYSTEROSCOPY WITH RESECTOCOPE;  Surgeon: Lyman Speller, MD;  Location: [facility];  Service: Gynecology;  Laterality: N/A;  . LAPAROSCOPIC CHOLECYSTECTOMY      Past Medical History:  Diagnosis Date  . Anemia   . Hypertension    pt not taking medications at this time  . 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  Current Outpatient Medications  Medication Sig Dispense Refill  . aspirin 81 MG tablet Take 81 mg by mouth daily.    . calcium carbonate (TUMS - DOSED IN MG ELEMENTAL CALCIUM) 500 MG chewable tablet Chew 1 tablet by mouth daily. Reported on 12/31/2015    . cholecalciferol (VITAMIN D) 1000 UNITS tablet Take 1,000-2,000 Units by mouth See admin instructions. Reported on 12/31/2015     No current facility-administered medications for this visit.     ROS: A comprehensive review of systems was negative.  Exam:    BP (!) 140/98 (BP Location: Right Arm, Patient Position: Sitting, Cuff Size: Normal)   Pulse (!) 56   Resp 16   Ht 5' 2.25" (1.581 m)   Wt 139 lb 6.4 oz (63.2 kg)   LMP 05/17/2016 (Approximate)   BMI 25.29 kg/m   General appearance: alert and cooperative Head: Normocephalic, without obvious abnormality, atraumatic Neck: no adenopathy, supple, symmetrical, trachea midline and thyroid not enlarged, symmetric, no tenderness/mass/nodules Lungs: clear to auscultation bilaterally Heart: regular rate and rhythm, S1, S2 normal, no  murmur, click, rub or gallop  A:  Right solid ovarian mass now with cystic features as well in a PMP women Normal Ca-125 Fibroid uterus     P:  Laparoscopic right ovarian cystectomy vs RSO and left salpingectomy planned Medications/Vitamins reviewed.  Pt knows needs to stop ASA products. Brochure given for pre and post op instructions.

## 2018-05-09 ENCOUNTER — Other Ambulatory Visit: Payer: Self-pay | Admitting: Obstetrics & Gynecology

## 2018-05-11 ENCOUNTER — Other Ambulatory Visit: Payer: Self-pay

## 2018-05-11 ENCOUNTER — Encounter (HOSPITAL_BASED_OUTPATIENT_CLINIC_OR_DEPARTMENT_OTHER): Payer: Self-pay | Admitting: *Deleted

## 2018-05-11 NOTE — Progress Notes (Addendum)
Spoke pt w/ via phone for pre-op interview.  Npo after mn.  Arrive at 0530.  Getting cbc and bmet done Thursday 05-12-2018 @ 1000.  Current ekg in chart and epic.

## 2018-05-12 ENCOUNTER — Encounter (HOSPITAL_COMMUNITY)
Admission: RE | Admit: 2018-05-12 | Discharge: 2018-05-12 | Disposition: A | Discharge status: Home / Self Care | Payer: No Typology Code available for payment source | Source: Ambulatory Visit | Attending: Obstetrics & Gynecology | Admitting: Obstetrics & Gynecology

## 2018-05-12 ENCOUNTER — Other Ambulatory Visit (HOSPITAL_COMMUNITY): Payer: No Typology Code available for payment source

## 2018-05-12 DIAGNOSIS — Z833 Family history of diabetes mellitus: Secondary | ICD-10-CM | POA: Diagnosis not present

## 2018-05-12 DIAGNOSIS — Z8489 Family history of other specified conditions: Secondary | ICD-10-CM | POA: Diagnosis not present

## 2018-05-12 DIAGNOSIS — N838 Other noninflammatory disorders of ovary, fallopian tube and broad ligament: Secondary | ICD-10-CM | POA: Diagnosis not present

## 2018-05-12 DIAGNOSIS — Z01818 Encounter for other preprocedural examination: Secondary | ICD-10-CM | POA: Insufficient documentation

## 2018-05-12 DIAGNOSIS — Z8249 Family history of ischemic heart disease and other diseases of the circulatory system: Secondary | ICD-10-CM | POA: Diagnosis not present

## 2018-05-12 DIAGNOSIS — I10 Essential (primary) hypertension: Secondary | ICD-10-CM | POA: Diagnosis not present

## 2018-05-12 DIAGNOSIS — N83201 Unspecified ovarian cyst, right side: Secondary | ICD-10-CM | POA: Diagnosis present

## 2018-05-12 DIAGNOSIS — D4959 Neoplasm of unspecified behavior of other genitourinary organ: Secondary | ICD-10-CM | POA: Insufficient documentation

## 2018-05-12 DIAGNOSIS — D251 Intramural leiomyoma of uterus: Secondary | ICD-10-CM | POA: Diagnosis not present

## 2018-05-12 DIAGNOSIS — D270 Benign neoplasm of right ovary: Secondary | ICD-10-CM | POA: Diagnosis not present

## 2018-05-12 LAB — BASIC METABOLIC PANEL
Anion gap: 8 (ref 5–15)
BUN: 11 mg/dL (ref 6–20)
CHLORIDE: 108 mmol/L (ref 98–111)
CO2: 27 mmol/L (ref 22–32)
CREATININE: 0.83 mg/dL (ref 0.44–1.00)
Calcium: 9.5 mg/dL (ref 8.9–10.3)
GFR calc non Af Amer: >60 mL/min (ref >60)
Glucose, Bld: 93 mg/dL (ref 70–99)
Potassium: 3.9 mmol/L (ref 3.5–5.1)
SODIUM: 143 mmol/L (ref 135–145)

## 2018-05-12 LAB — CBC
HCT: 40 % (ref 36.0–46.0)
Hemoglobin: 13.2 g/dL (ref 12.0–15.0)
MCH: 30.3 pg (ref 26.0–34.0)
MCHC: 33 g/dL (ref 30.0–36.0)
MCV: 91.7 fL (ref 78.0–100.0)
PLATELETS: 223 10*3/uL (ref 150–400)
RBC: 4.36 MIL/uL (ref 3.87–5.11)
RDW: 12.5 % (ref 11.5–15.5)
WBC: 5.7 10*3/uL (ref 4.0–10.5)

## 2018-05-15 NOTE — Anesthesia Preprocedure Evaluation (Addendum)
Anesthesia Evaluation  Patient identified by MRN, date of birth, ID band Patient awake    Reviewed: Allergy & Precautions, NPO status , Patient's Chart, lab work & pertinent test results  History of Anesthesia Complications Negative for: history of anesthetic complications  Airway Mallampati: II  TM Distance: >3 FB Neck ROM: Full    Dental no notable dental hx.    Pulmonary neg pulmonary ROS,    Pulmonary exam normal        Cardiovascular hypertension (not currently medicated), Normal cardiovascular exam     Neuro/Psych negative neurological ROS  negative psych ROS   GI/Hepatic negative GI ROS, Neg liver ROS,   Endo/Other  negative endocrine ROS  Renal/GU negative Renal ROS  negative genitourinary   Musculoskeletal negative musculoskeletal ROS (+)   Abdominal   Peds  Hematology negative hematology ROS (+)   Anesthesia Other Findings   Reproductive/Obstetrics negative OB ROS                           Anesthesia Physical Anesthesia Plan  ASA: III  Anesthesia Plan: General   Post-op Pain Management:    Induction: Intravenous  PONV Risk Score and Plan: 4 or greater and Ondansetron, Treatment may vary due to age or medical condition, Dexamethasone and Midazolam  Airway Management Planned: Oral ETT  Additional Equipment: None  Intra-op Plan:   Post-operative Plan: Extubation in OR  Informed Consent: I have reviewed the patients History and Physical, chart, labs and discussed the procedure including the risks, benefits and alternatives for the proposed anesthesia with the patient or authorized representative who has indicated his/her understanding and acceptance.     Plan Discussed with:   Anesthesia Plan Comments:       Anesthesia Quick Evaluation

## 2018-05-16 ENCOUNTER — Other Ambulatory Visit: Payer: Self-pay

## 2018-05-16 ENCOUNTER — Ambulatory Visit (HOSPITAL_BASED_OUTPATIENT_CLINIC_OR_DEPARTMENT_OTHER): Payer: No Typology Code available for payment source | Admitting: Anesthesiology

## 2018-05-16 ENCOUNTER — Ambulatory Visit (HOSPITAL_BASED_OUTPATIENT_CLINIC_OR_DEPARTMENT_OTHER)
Admission: RE | Admit: 2018-05-16 | Discharge: 2018-05-16 | Disposition: A | Discharge status: Home / Self Care | Payer: No Typology Code available for payment source | Source: Ambulatory Visit | Attending: Obstetrics & Gynecology | Admitting: Obstetrics & Gynecology

## 2018-05-16 ENCOUNTER — Encounter (HOSPITAL_BASED_OUTPATIENT_CLINIC_OR_DEPARTMENT_OTHER)
Admission: RE | Disposition: A | Discharge status: Home / Self Care | Payer: Self-pay | Source: Ambulatory Visit | Attending: Obstetrics & Gynecology

## 2018-05-16 ENCOUNTER — Encounter (HOSPITAL_BASED_OUTPATIENT_CLINIC_OR_DEPARTMENT_OTHER): Payer: Self-pay

## 2018-05-16 DIAGNOSIS — Z8489 Family history of other specified conditions: Secondary | ICD-10-CM | POA: Insufficient documentation

## 2018-05-16 DIAGNOSIS — Z8249 Family history of ischemic heart disease and other diseases of the circulatory system: Secondary | ICD-10-CM | POA: Insufficient documentation

## 2018-05-16 DIAGNOSIS — N83291 Other ovarian cyst, right side: Secondary | ICD-10-CM

## 2018-05-16 DIAGNOSIS — Z833 Family history of diabetes mellitus: Secondary | ICD-10-CM | POA: Insufficient documentation

## 2018-05-16 DIAGNOSIS — D270 Benign neoplasm of right ovary: Secondary | ICD-10-CM | POA: Diagnosis not present

## 2018-05-16 DIAGNOSIS — N838 Other noninflammatory disorders of ovary, fallopian tube and broad ligament: Secondary | ICD-10-CM | POA: Insufficient documentation

## 2018-05-16 DIAGNOSIS — I10 Essential (primary) hypertension: Secondary | ICD-10-CM | POA: Insufficient documentation

## 2018-05-16 HISTORY — DX: Personal history of diseases of the blood and blood-forming organs and certain disorders involving the immune mechanism: Z86.2

## 2018-05-16 HISTORY — PX: LAPAROSCOPIC BILATERAL SALPINGECTOMY: SHX5889

## 2018-05-16 HISTORY — DX: Neoplasm of unspecified behavior of other genitourinary organ: D49.59

## 2018-05-16 HISTORY — DX: Presence of spectacles and contact lenses: Z97.3

## 2018-05-16 SURGERY — SALPINGECTOMY, BILATERAL, LAPAROSCOPIC
Anesthesia: General | Laterality: Right

## 2018-05-16 MED ORDER — GLYCOPYRROLATE PF 0.2 MG/ML IJ SOSY
PREFILLED_SYRINGE | INTRAMUSCULAR | Status: AC
  Filled 2018-05-16: qty 1

## 2018-05-16 MED ORDER — OXYCODONE HCL 5 MG PO TABS
ORAL_TABLET | ORAL | Status: AC
  Filled 2018-05-16: qty 1

## 2018-05-16 MED ORDER — ONDANSETRON HCL 4 MG/2ML IJ SOLN
INTRAMUSCULAR | Status: DC | PRN
  Administered 2018-05-16: 4 mg via INTRAVENOUS

## 2018-05-16 MED ORDER — HYDROCODONE-ACETAMINOPHEN 5-325 MG PO TABS: 1.0000 | ORAL_TABLET | Freq: Four times a day (QID) | ORAL | 0 refills | Status: AC | PRN

## 2018-05-16 MED ORDER — OXYCODONE HCL 5 MG/5ML PO SOLN
5.0000 mg | Freq: Once | ORAL | Status: AC | PRN
  Filled 2018-05-16: qty 5

## 2018-05-16 MED ORDER — FENTANYL CITRATE (PF) 100 MCG/2ML IJ SOLN
INTRAMUSCULAR | Status: AC
  Filled 2018-05-16: qty 2

## 2018-05-16 MED ORDER — ONDANSETRON HCL 4 MG/2ML IJ SOLN
4.0000 mg | Freq: Once | INTRAMUSCULAR | Status: DC | PRN
  Filled 2018-05-16: qty 2

## 2018-05-16 MED ORDER — MIDAZOLAM HCL 2 MG/2ML IJ SOLN
INTRAMUSCULAR | Status: AC
  Filled 2018-05-16: qty 2

## 2018-05-16 MED ORDER — MIDAZOLAM HCL 5 MG/5ML IJ SOLN
INTRAMUSCULAR | Status: DC | PRN
  Administered 2018-05-16: 2 mg via INTRAVENOUS

## 2018-05-16 MED ORDER — DEXAMETHASONE SODIUM PHOSPHATE 10 MG/ML IJ SOLN
INTRAMUSCULAR | Status: DC | PRN
  Administered 2018-05-16: 5 mg via INTRAVENOUS

## 2018-05-16 MED ORDER — BUPIVACAINE HCL 0.25 % IJ SOLN
INTRAMUSCULAR | Status: DC | PRN
  Administered 2018-05-16: 7 mL

## 2018-05-16 MED ORDER — IBUPROFEN 800 MG PO TABS: 800.0000 mg | ORAL_TABLET | Freq: Three times a day (TID) | ORAL | 0 refills | Status: DC | PRN

## 2018-05-16 MED ORDER — OXYCODONE HCL 5 MG PO TABS
5.0000 mg | ORAL_TABLET | Freq: Once | ORAL | Status: AC | PRN
  Administered 2018-05-16: 5 mg via ORAL
  Filled 2018-05-16: qty 1

## 2018-05-16 MED ORDER — ONDANSETRON HCL 4 MG/2ML IJ SOLN
INTRAMUSCULAR | Status: AC
  Filled 2018-05-16: qty 2

## 2018-05-16 MED ORDER — KETOROLAC TROMETHAMINE 30 MG/ML IJ SOLN
INTRAMUSCULAR | Status: DC | PRN
  Administered 2018-05-16: 30 mg via INTRAVENOUS

## 2018-05-16 MED ORDER — HYDROCODONE-ACETAMINOPHEN 5-325 MG PO TABS: 1.0000 | ORAL_TABLET | Freq: Four times a day (QID) | ORAL | 0 refills | Status: DC | PRN

## 2018-05-16 MED ORDER — SUGAMMADEX SODIUM 500 MG/5ML IV SOLN
INTRAVENOUS | Status: AC
  Filled 2018-05-16: qty 5

## 2018-05-16 MED ORDER — LACTATED RINGERS IV SOLN
INTRAVENOUS | Status: DC
  Administered 2018-05-16: 10:00:00 via INTRAVENOUS
  Administered 2018-05-16: 1000 mL via INTRAVENOUS
  Filled 2018-05-16: qty 1000

## 2018-05-16 MED ORDER — BUPIVACAINE HCL (PF) 0.25 % IJ SOLN
INTRAMUSCULAR | Status: AC
  Filled 2018-05-16: qty 30

## 2018-05-16 MED ORDER — DEXAMETHASONE SODIUM PHOSPHATE 10 MG/ML IJ SOLN
INTRAMUSCULAR | Status: AC
  Filled 2018-05-16: qty 1

## 2018-05-16 MED ORDER — FENTANYL CITRATE (PF) 100 MCG/2ML IJ SOLN
25.0000 ug | INTRAMUSCULAR | Status: DC | PRN
  Administered 2018-05-16: 25 ug via INTRAVENOUS
  Filled 2018-05-16: qty 1

## 2018-05-16 MED ORDER — ONDANSETRON 4 MG PO TBDP
ORAL_TABLET | ORAL | Status: AC
  Filled 2018-05-16: qty 1

## 2018-05-16 MED ORDER — ROCURONIUM BROMIDE 100 MG/10ML IV SOLN
INTRAVENOUS | Status: DC | PRN
  Administered 2018-05-16: 50 mg via INTRAVENOUS

## 2018-05-16 MED ORDER — LIDOCAINE 2% (20 MG/ML) 5 ML SYRINGE
INTRAMUSCULAR | Status: AC
  Filled 2018-05-16: qty 5

## 2018-05-16 MED ORDER — PROPOFOL 10 MG/ML IV BOLUS
INTRAVENOUS | Status: AC
  Filled 2018-05-16: qty 20

## 2018-05-16 MED ORDER — GLYCOPYRROLATE 0.2 MG/ML IJ SOLN
INTRAMUSCULAR | Status: DC | PRN
  Administered 2018-05-16: 0.2 mg via INTRAVENOUS

## 2018-05-16 MED ORDER — ROCURONIUM BROMIDE 10 MG/ML (PF) SYRINGE
PREFILLED_SYRINGE | INTRAVENOUS | Status: AC
  Filled 2018-05-16: qty 10

## 2018-05-16 MED ORDER — FENTANYL CITRATE (PF) 100 MCG/2ML IJ SOLN
INTRAMUSCULAR | Status: DC | PRN
  Administered 2018-05-16 (×2): 100 ug via INTRAVENOUS
  Administered 2018-05-16: 50 ug via INTRAVENOUS

## 2018-05-16 MED ORDER — SODIUM CHLORIDE 0.9 % IR SOLN
Status: DC | PRN
  Administered 2018-05-16: 3000 mL

## 2018-05-16 MED ORDER — PROPOFOL 10 MG/ML IV BOLUS
INTRAVENOUS | Status: DC | PRN
  Administered 2018-05-16: 130 mg via INTRAVENOUS

## 2018-05-16 MED ORDER — LIDOCAINE HCL (CARDIAC) PF 100 MG/5ML IV SOSY
PREFILLED_SYRINGE | INTRAVENOUS | Status: DC | PRN
  Administered 2018-05-16: 60 mg via INTRAVENOUS

## 2018-05-16 MED ORDER — KETOROLAC TROMETHAMINE 30 MG/ML IJ SOLN
INTRAMUSCULAR | Status: AC
  Filled 2018-05-16: qty 1

## 2018-05-16 MED ORDER — ONDANSETRON 4 MG PO TBDP
4.0000 mg | ORAL_TABLET | Freq: Once | ORAL | Status: AC
  Administered 2018-05-16: 4 mg via ORAL
  Filled 2018-05-16: qty 1

## 2018-05-16 MED ORDER — FENTANYL CITRATE (PF) 250 MCG/5ML IJ SOLN
INTRAMUSCULAR | Status: AC
  Filled 2018-05-16: qty 5

## 2018-05-16 MED ORDER — SUGAMMADEX SODIUM 200 MG/2ML IV SOLN
INTRAVENOUS | Status: DC | PRN
  Administered 2018-05-16: 200 mg via INTRAVENOUS

## 2018-05-16 MED FILL — IBUPROFEN 800 MG TAB: 800 | 10 days supply | Qty: 30 | Fill #0

## 2018-05-16 MED FILL — HYDROCODON-APAP 5-325: 5-325 | 3 days supply | Qty: 20 | Fill #0

## 2018-05-16 SURGICAL SUPPLY — 67 items
ADH SKN CLS APL DERMABOND .7 (GAUZE/BANDAGES/DRESSINGS) ×1
APL SKNCLS STERI-STRIP NONHPOA (GAUZE/BANDAGES/DRESSINGS)
APL SRG 38 LTWT LNG FL B (MISCELLANEOUS)
APPLICATOR ARISTA FLEXITIP XL (MISCELLANEOUS) IMPLANT
BENZOIN TINCTURE PRP APPL 2/3 (GAUZE/BANDAGES/DRESSINGS) IMPLANT
BLADE CLIPPER SURG (BLADE) IMPLANT
CABLE HIGH FREQUENCY MONO STRZ (ELECTRODE) ×2 IMPLANT
CANISTER SUCT 3000ML PPV (MISCELLANEOUS) ×1 IMPLANT
CANISTER SUCTION 1200CC (MISCELLANEOUS) ×1 IMPLANT
COVER MAYO STAND STRL (DRAPES) ×2 IMPLANT
DECANTER SPIKE VIAL GLASS SM (MISCELLANEOUS) ×2 IMPLANT
DERMABOND ADVANCED (GAUZE/BANDAGES/DRESSINGS) ×1
DERMABOND ADVANCED .7 DNX12 (GAUZE/BANDAGES/DRESSINGS) ×1 IMPLANT
DILATOR CANAL MILEX (MISCELLANEOUS) ×1 IMPLANT
DRSG COVADERM PLUS 2X2 (GAUZE/BANDAGES/DRESSINGS) IMPLANT
DRSG OPSITE POSTOP 3X4 (GAUZE/BANDAGES/DRESSINGS) IMPLANT
DRSG TELFA 3X8 NADH (GAUZE/BANDAGES/DRESSINGS) IMPLANT
DURAPREP 26ML APPLICATOR (WOUND CARE) ×2 IMPLANT
GAUZE SPONGE 4X4 16PLY XRAY LF (GAUZE/BANDAGES/DRESSINGS) ×4 IMPLANT
GLOVE BIOGEL PI IND STRL 7.0 (GLOVE) ×2 IMPLANT
GLOVE BIOGEL PI INDICATOR 7.0 (GLOVE) ×2
GLOVE ECLIPSE 6.5 STRL STRAW (GLOVE) ×4 IMPLANT
GLOVE INDICATOR 7.0 STRL GRN (GLOVE) ×2 IMPLANT
GOWN STRL REUS W/TWL LRG LVL3 (GOWN DISPOSABLE) ×2 IMPLANT
HEMOSTAT ARISTA ABSORB 3G PWDR (MISCELLANEOUS) IMPLANT
KIT TURNOVER CYSTO (KITS) ×2 IMPLANT
LIGASURE VESSEL 5MM BLUNT TIP (ELECTROSURGICAL) ×1 IMPLANT
NDL HYPO 25X1 1.5 SAFETY (NEEDLE) ×1 IMPLANT
NEEDLE HYPO 25X1 1.5 SAFETY (NEEDLE) ×2 IMPLANT
NEEDLE INSUFFLATION 120MM (ENDOMECHANICALS) ×2 IMPLANT
NEEDLE INSUFFLATION 14GA 150MM (NEEDLE) IMPLANT
NS IRRIG 500ML POUR BTL (IV SOLUTION) ×2 IMPLANT
PACK LAPAROSCOPY BASIN (CUSTOM PROCEDURE TRAY) ×2 IMPLANT
PACK TRENDGUARD 450 HYBRID PRO (MISCELLANEOUS) ×1 IMPLANT
PAD OB MATERNITY 4.3X12.25 (PERSONAL CARE ITEMS) ×2 IMPLANT
POUCH LAPAROSCOPIC INSTRUMENT (MISCELLANEOUS) ×2 IMPLANT
PROTECTOR NERVE ULNAR (MISCELLANEOUS) ×2 IMPLANT
SCISSORS LAP 5X35 DISP (ENDOMECHANICALS) IMPLANT
SEALER TISSUE G2 CVD JAW 35 (ENDOMECHANICALS) IMPLANT
SEALER TISSUE G2 CVD JAW 45CM (ENDOMECHANICALS)
SET IRRIG TUBING LAPAROSCOPIC (IRRIGATION / IRRIGATOR) ×2 IMPLANT
SHEARS HARMONIC ACE PLUS 36CM (ENDOMECHANICALS) IMPLANT
SLEEVE ADV FIXATION 5X100MM (TROCAR) ×2 IMPLANT
SOLUTION ELECTROLUBE (MISCELLANEOUS) IMPLANT
STRIP CLOSURE SKIN 1/4X4 (GAUZE/BANDAGES/DRESSINGS) IMPLANT
SUT VIC AB 0 SH 27 (SUTURE) ×1 IMPLANT
SUT VIC AB 4-0 SH 27 (SUTURE)
SUT VIC AB 4-0 SH 27XANBCTRL (SUTURE) IMPLANT
SUT VICRYL 0 UR6 27IN ABS (SUTURE) IMPLANT
SUT VICRYL 4-0 PS2 18IN ABS (SUTURE) ×2 IMPLANT
SYR 10ML LL (SYRINGE) ×2 IMPLANT
SYR 30ML LL (SYRINGE) IMPLANT
SYR 3ML 23GX1 SAFETY (SYRINGE) IMPLANT
SYS BAG RETRIEVAL 10MM (BASKET) ×2
SYSTEM BAG RETRIEVAL 10MM (BASKET) IMPLANT
SYSTEM CARTER THOMASON II (TROCAR) ×1 IMPLANT
TOWEL OR 17X24 6PK STRL BLUE (TOWEL DISPOSABLE) ×4 IMPLANT
TRAY FOLEY MTR SLVR 14FR STAT (SET/KITS/TRAYS/PACK) ×1 IMPLANT
TRAY FOLEY W/BAG SLVR 14FR (SET/KITS/TRAYS/PACK) IMPLANT
TRENDGUARD 450 HYBRID PRO PACK (MISCELLANEOUS) ×2
TROCAR ADV FIXATION 5X100MM (TROCAR) ×2 IMPLANT
TROCAR XCEL NON-BLD 11X100MML (ENDOMECHANICALS) ×1 IMPLANT
TROCAR XCEL NON-BLD 5MMX100MML (ENDOMECHANICALS) ×2 IMPLANT
TUBE CONNECTING 12X1/4 (SUCTIONS) IMPLANT
TUBING INSUF HEATED (TUBING) ×2 IMPLANT
WARMER LAPAROSCOPE (MISCELLANEOUS) ×2 IMPLANT
WATER STERILE IRR 500ML POUR (IV SOLUTION) ×2 IMPLANT

## 2018-05-16 NOTE — Anesthesia Postprocedure Evaluation (Signed)
Anesthesia Post Note  Patient: Nancy Carter  Procedure(s) Performed: WITH RIGHT OOPHORECTOMY (Right )     Patient location during evaluation: PACU Anesthesia Type: General Level of consciousness: awake and alert Pain management: pain level controlled Vital Signs Assessment: post-procedure vital signs reviewed and stable Respiratory status: spontaneous breathing, nonlabored ventilation, respiratory function stable and patient connected to nasal cannula oxygen Cardiovascular status: blood pressure returned to baseline and stable Postop Assessment: no apparent nausea or vomiting Anesthetic complications: no    Last Vitals:  Vitals:   05/16/18 0945 05/16/18 1000  BP: 127/80 133/81  Pulse: 64 62  Resp: 13 18  Temp:    SpO2: 100% 100%    Last Pain:  Vitals:   05/16/18 1009  TempSrc:   PainSc: 3                  Lidia Collum

## 2018-05-16 NOTE — Anesthesia Procedure Notes (Signed)
Procedure Name: Intubation Date/Time: 05/16/2018 7:40 AM Performed by: Jonna Munro, CRNA Pre-anesthesia Checklist: Patient identified, Emergency Drugs available, Suction available, Patient being monitored and Timeout performed Patient Re-evaluated:Patient Re-evaluated prior to induction Oxygen Delivery Method: Circle system utilized Preoxygenation: Pre-oxygenation with 100% oxygen Induction Type: IV induction Laryngoscope Size: Mac and 3 Grade View: Grade III Tube type: Oral Tube size: 7.0 mm Number of attempts: 2 Airway Equipment and Method: Stylet and Video-laryngoscopy Placement Confirmation: ETT inserted through vocal cords under direct vision,  positive ETCO2 and breath sounds checked- equal and bilateral Secured at: 21 cm Tube secured with: Tape Dental Injury: Teeth and Oropharynx as per pre-operative assessment  Difficulty Due To: Difficulty was anticipated and Difficult Airway- due to anterior larynx Comments: DL with MAC 3 blade, unable to see cords, ETT placed, no ETCO2 received, ETT immediately removed. Patient mask ventilated easily, DL with glidescope, MAC 3, cords visualized, ETT placed, correct placement verified by receiving ETCO2, equal BBS. Intubation attempt times 2 atraumatic

## 2018-05-16 NOTE — Addendum Note (Signed)
Addendum  created 05/16/18 1200 by Lidia Collum, MD   Order list changed

## 2018-05-16 NOTE — Transfer of Care (Signed)
Immediate Anesthesia Transfer of Care Note  Patient: MCKENIZE MEZERA  Procedure(s) Performed: WITH RIGHT OOPHORECTOMY (Right )  Patient Location: PACU  Anesthesia Type:General  Level of Consciousness: awake, alert  and oriented  Airway & Oxygen Therapy: Patient Spontanous Breathing and Patient connected to nasal cannula oxygen  Post-op Assessment: Report given to RN and Post -op Vital signs reviewed and stable  Post vital signs: Reviewed and stable  Last Vitals:  Vitals Value Taken Time  BP    Temp    Pulse    Resp    SpO2      Last Pain:  Vitals:   05/16/18 0540  TempSrc: Oral      Patients Stated Pain Goal: 8 (15/94/70 7615)  Complications: No apparent anesthesia complications

## 2018-05-16 NOTE — Op Note (Signed)
05/16/2018  8:38 AM  PATIENT:  Nancy Carter  57 y.o. 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  Normal appendix.   DESCRIPTION OF OPERATION: Patient is taken to the operating room. She is placed in the supine position. She is a running IV in place. Informed consent was present on the chart. SCDs on her lower extremities and functioning properly. Patient was positioned while she was awake.  Her legs were placed in the low lithotomy position in [place]. Her arms were tucked by the side.  General endotracheal anesthesia was administered by the anesthesia staff without difficulty.  Time out performed.    Chlora prep was then used to prep the abdomen and Betadine was used to prep the inner thighs, perineum and vagina. Once 3 minutes had past the patient was draped in a normal standard fashion. The legs were lifted to the high lithotomy position. The cervix was visualized by placing a small bivalved graves speculum in the vagina.  The anterior lip of the cervix was grasped with single-tooth tenaculum.  The cervix was dilated using an os finder.  Then the uterus sounded to  7 cm. A Hulka clamp was passed through the cervical canal and attached to the anterior lip of the cervix without difficulty.  The tenaculum was removed.   Speculum was removed.  There was good manipulation of the uterus.  A Foley catheter was placed to straight drain.  Clear urine was noted. Legs were lowered to the low lithotomy position and attention was turned the abdomen.  The umbilicus was everted.  A Veress needle was obtained. Syringe of sterile saline was placed on a open Veress needle.  This was passed into the umbilicus until just when the fluid started to drip.  Then low flow CO2 gas was attached the needle and the pneumoperitoneum was achieved without difficulty. Once 2.5 liters of gas was in the abdomen the Veress needle was removed and a 5 millimeter non-bladed Optiview trocar and port were passed directly to the abdomen. The laparoscope was then used to confirm intraperitoneal placement.  A fibroid uterus was noted with anterior fibroid.  Upper abdomen was normal except gall bladder was absent due to prior surgery.  Pelvis was normal except for an irregular appearing mass that was part of the right ovary.  This looked contiguous with the ovary and not separate so I did not feel I could remove this without possibly entering cystic areas within this mass.  The left ovary appeared normal.    Locations for RLQ and LLQ ports were identified by transillumination of the abdominal wall.  0.25% marcaine was used to anesthetize the skin.  28mm skin incision was made in the RLQ and a non bladed 11 trochar and port was placed in the RLQ without difficulty.    Then a 11mm skin incision  was made and a 65mm nonbladed trochar and port was placed in the LLQ.  All trochars were removed.    Pelvic washings were obtained.  Ureters were identifies.  Attention was turned to the right side. With uterus on stretch the right IP ligament was serially clamped, cauterized and incised with the ligasure device until this was close to the uterus.  Then, keeping flush with the fundus of the uterus, the utero-ovarian pedicle was serially clamped, cauterized and incised until the  right ovary and tube were free.  Excellent hemostasis was noted.  This specimen was placed in the pelvis.    Attention was turned the left side.  The uterus was placed on stretch to the opposite side.  The tube was excised off the ovary using the Ligasure device.  The mesosalpinx was incised freeing the tube from the mesosalpinx.  Finally, keeping flush with the uterus, the tube was clamped, cauterized and incised, freeing the tube.    Than a disposable specimen bag for a 67mm port was placed in the pelvis the specimens were put in the bag and brought out.  The port was removed and the tube an ovary were removed without any fluid spill in the pelvis.    At this point, no other procedure was needed.  The CO2 gas was decreased to 58mm hg and all pedicles were inspected for several minutes.  No bleeding was noted.  The largest port in the RLQ was closed with a fascial closure device after the port was removed.  Suture was tied and excellent closure of the fascia was noted.  The remaining instruments were removed.  The pneumoperitoneum was relieved.  The patient was taken out of Trendelenburg positioning.  Several deep breaths were given to the patient's trying to any gas the abdomen and finally the final two ports were removed.  The skin was then closed with subcuticular stitches of 3-0 Vicryl. The skin was cleansed Dermabond was applied. Attention was then turned the vagina and the Hulka clamp was removed.  No bleeding was noted from the cervix.  The foley was removed.  Sponge, lap, needle, initially counts were correct x2. Patient tolerated the procedure very well. She was awakened from anesthesia, extubated and taken to recovery in stable condition.   COUNTS:  YES  PLAN OF CARE: Transfer to PACU

## 2018-05-16 NOTE — H&P (Signed)
Nancy Carter is an 57 y.o. female  G0 MWF with hx of solid right ovarian mass that had been followed for several years until this year when the ultrasound also showed a cystic lesion present with the mass.  Imaging was in May and ca-125 was 10.6.  She has known fibroids as well with the largest meauring 3.1 x 2.8cm.  Rigth ovary contains two small cystic areas measuring 16 x 14 and 53mm as well as a 10x12 more solid mass with calcifications.  All ovarian lesions are avascular and consistent with either fibroma or dermoid.  We have discussed removal of left fallopian tube as well as right cystectomy, depending on what the ovary looks like, versus RSO.  She is ok with either procedure.  Risks, benefits and alternatives have been reviewed.  Questions answered.  Pt here with spouse and ready to proceed.    Ob Hx:   Patient's last menstrual period was 05/17/2016 (approximate).          Sexually active: Yes.   Birth control: post menopausal   Last pap: 10/11/17 Neg. HR HPV:neg  Last MMG: 02/11/18 BIRADS1:Neg  Tobacco: No  Pertinent Gynecological History: Menses: post-menopausal Bleeding: none Contraception: post menopausal status DES exposure: denies Blood transfusions: none Sexually transmitted diseases: no past history Previous GYN Procedures: none  Last mammogram: normal Date: 02/11/18 Last pap: normal Date: 2/29/29 with neg HR HPV OB History: G0, P0      Past Medical History:  Diagnosis Date  . History of anemia   . Ovarian neoplasm    right side  . Wears glasses     Past Surgical History:  Procedure Laterality Date  . COLONOSCOPY WITH PROPOFOL  08/21/2013  . DILATATION & CURRETTAGE/HYSTEROSCOPY WITH RESECTOCOPE N/A 04/10/2013   Procedure: DILATATION & CURETTAGE/HYSTEROSCOPY WITH RESECTOCOPE;  Surgeon: Lyman Speller, MD;  Location: [facility];  Service: Gynecology;  Laterality: N/A;  . LAPAROSCOPIC CHOLECYSTECTOMY  09-09-2001  dr Dalbert Batman  @[facility]     Family History  Problem  Relation Age of Onset  . Diabetes Maternal Grandmother   . Hypertension Maternal Grandfather   . Hypertension Paternal Grandfather   . Diabetes Mother   . Hypertension Mother   . Hypertension Brother   . Peripheral vascular disease Brother   . Obesity Brother   . Colon cancer Neg Hx   . Pancreatic cancer Neg Hx   . Stomach cancer Neg Hx   . Breast cancer Neg Hx     Social History:  reports that she has never smoked. She has never used smokeless tobacco. She reports that she does not drink alcohol or use drugs.  Allergies: No Known Allergies  Medications Prior to Admission  Medication Sig Dispense Refill Last Dose  . Multiple Vitamin (MULTIVITAMIN) tablet Take 1 tablet by mouth daily.   05/10/2018    Review of Systems  All other systems reviewed and are negative.   Blood pressure (!) 161/86, pulse (!) 45, temperature 98.6 F (37 C), temperature source Oral, resp. rate 16, height 5' 2.25" (1.581 m), weight 63.4 kg, last menstrual period 06/16/2016, SpO2 100 %. Physical Exam  Constitutional: She is oriented to person, place, and time. She appears well-developed and well-nourished.  Cardiovascular: Normal rate and regular rhythm.  Respiratory: Effort normal and breath sounds normal.  Neurological: She is alert and oriented to person, place, and time.  Skin: Skin is warm and dry.  Psychiatric: She has a normal mood and affect.    No results found for this  or any previous visit (from the past 24 hour(s)).  No results found.  Assessment/Plan: 57 yo G0 MWF with solid and cystic right ovarian lesion with normal ca-125 here for right cystectomy vs RSO and bilateral salpingectomy.  Questions answered.  Pt ready to proceed.  Megan Salon 05/16/2018, 7:14 AM

## 2018-05-16 NOTE — Discharge Instructions (Signed)

## 2018-05-17 ENCOUNTER — Encounter (HOSPITAL_BASED_OUTPATIENT_CLINIC_OR_DEPARTMENT_OTHER): Payer: Self-pay | Admitting: Obstetrics & Gynecology

## 2018-05-24 ENCOUNTER — Ambulatory Visit (INDEPENDENT_AMBULATORY_CARE_PROVIDER_SITE_OTHER): Payer: No Typology Code available for payment source | Admitting: Obstetrics & Gynecology

## 2018-05-24 ENCOUNTER — Encounter: Payer: Self-pay | Admitting: Obstetrics & Gynecology

## 2018-05-24 VITALS — BP 132/96 | HR 60 | Resp 14 | Ht 62.25 in | Wt 140.8 lb

## 2018-05-24 DIAGNOSIS — D4959 Neoplasm of unspecified behavior of other genitourinary organ: Secondary | ICD-10-CM

## 2018-05-24 NOTE — Progress Notes (Signed)
Post Operative Visit  Procedure: Bilateral Salpingectomy, Right Laparoscopic Ovarian Cystectomy with Oophorectomy.  Days Post-op: 8  Subjective: Doing well.  Still taking 1/2 tab of ibuprofen still.  Only took prescription pain medication for one day post op.  Having regular bowel movements and emptying bladder well.  Did have some gas pain but this has resolved.  Energy feels "pretty good".  Planning on going back to work next week.  Feels this will not be a problem.    Objective: BP (!) 132/96 (BP Location: Left Arm, Patient Position: Sitting, Cuff Size: Normal)   Pulse 60   Resp 14   Ht 5' 2.25" (1.581 m)   Wt 140 lb 12.8 oz (63.9 kg)   LMP 06/16/2016   BMI 25.55 kg/m   EXAM General: alert and no distress Resp: clear to auscultation bilaterally Cardio: regular rate and rhythm, S1, S2 normal, no murmur, click, rub or gallop GI: soft, non-tender; bowel sounds normal; no masses,  no organomegaly and incision: clean, dry and intact Extremities: extremities normal, atraumatic, no cyanosis or edema Vaginal Bleeding: none  Assessment: s/p laparoscopic RSO and left salpingectomy  Plan: Can return to work without restrictions.  Paperwork signed for return to work.

## 2018-06-03 ENCOUNTER — Ambulatory Visit: Payer: No Typology Code available for payment source | Admitting: Obstetrics & Gynecology

## 2018-06-24 DIAGNOSIS — E663 Overweight: Secondary | ICD-10-CM | POA: Insufficient documentation

## 2018-06-24 DIAGNOSIS — Z6825 Body mass index (BMI) 25.0-25.9, adult: Secondary | ICD-10-CM | POA: Insufficient documentation

## 2018-06-24 NOTE — Progress Notes (Signed)
Complete Physical  Assessment and Plan:  Nancy Carter was seen today for annual exam   Diagnoses and all orders for this visit:  Encounter for routine adult health examination with abnormal findings  Essential hypertension Initiate medication: hctz 25 mg, take 1/2-1 tab daily for BP Monitor blood pressure at home; call if consistently over 130/80 Discussed DASH diet Advised to go to the ER if any CP, SOB, nausea, dizziness, severe HA, changes vision/speech, left arm numbness and tingling and jaw pain. Follow up in 2-3 months  Intramural leiomyoma of uterus      -       Continue f/u with GYN as recommended  Benign neoplasm of ovary    S/p recent oopherectomy, resolve   -      Continue f/u with GYN as recommended  Overweight Long discussion about weight loss, diet, and exercise Recommended diet heavy in fruits and veggies and low in animal meats, cheeses, and dairy products, appropriate calorie intake Discussed appropriate weight for height (below 138 lb) Follow up at next visit  Screening cholesterol level -     Lipid panel  Medication management -     CBC with Differential/Platelet -     CMP/GFR  Screening for deficiency anemia -     Iron,Total/Total Iron Binding Cap -     Vitamin B12  Screening for diabetes mellitus -     Hemoglobin A1c  Screening for hematuria or proteinuria -     Urinalysis w microscopic + reflex cultur  Screening for thyroid disorder -     TSH  Vitamin D deficiency -     VITAMIN D 25 Hydroxy (Vit-D Deficiency, Fractures)  H/O dysplastic nevus      -      No concerning areas today; continue follow up with derm; encouraged sunscreen use  Bradycardia       -     Without dizziness, orthostatic symptoms; patient to measure pulse response with exercise, notify of any dizziness, CP, SOB, syncopy with exercise or sudden position changes.        -     EKG 12-Lead  Discussed med's effects and SE's. Screening labs and tests as requested with regular  follow-up as recommended. Over 40 minutes of exam, counseling, chart review, and complex, high level critical decision making was performed this visit.   Future Appointments  Date Time Provider [place]  12/30/2018  9:15 AM Megan Salon, MD [place] None    HPI  57 y.o. Caucasian female, married without kids, works as a Patent examiner at [employer] presents for a complete physical and follow up for has Intramural leiomyoma of uterus; Ovarian benign neoplasm; Hypertension; H/O dysplastic nevus; Bradycardia; and BMI 25.0-25.9,adult on their problem list.   She has been followed  by Dr. 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   She sees dermatology Dr. Berdie Ogren annually for history of dysplastic nevus.   BMI is Body mass index is 25.95 kg/m., she has been working on diet and exercise, walking twice weekly, drinks ~ 1 gallon unsweet tea daily, she does drink 16 fluid ounces of diet soda daily, sparkling water.  Wt Readings from Last 3 Encounters:  06/27/18 143 lb (64.9 kg)  05/24/18 140 lb 12.8 oz (63.9 kg)  05/16/18 139 lb 12.8 oz (63.4 kg)   Her blood pressure has been controlled at home, today their BP  is BP: (!) 132/96 She does workout. She denies chest pain, shortness of breath, dizziness.   She is not on cholesterol medication and denies myalgias. Her cholesterol is at goal. The cholesterol last visit was:   Lab Results  Component Value Date   CHOL 153 06/23/2017   HDL 56 06/23/2017   LDLCALC 83 06/23/2017   TRIG 68 06/23/2017   CHOLHDL 2.7 06/23/2017   She has hx of prediabetes reversed by lifestyle modification; last A1C in the office was:  Lab Results  Component Value Date   HGBA1C 5.2 06/23/2017   Last GFR: Lab Results  Component Value Date   GFRNONAA >60 05/12/2018   Patient was not on Vitamin D supplement and below goal at  last visit:  Lab Results  Component Value Date   VD25OH 35 06/23/2017      Current Medications:  Current Outpatient Medications on File Prior to Visit  Medication Sig Dispense Refill  . ibuprofen (ADVIL,MOTRIN) 800 MG tablet Take 1 tablet (800 mg total) by mouth every 8 (eight) hours as needed. 30 tablet 0  . Multiple Vitamin (MULTIVITAMIN) tablet Take 1 tablet by mouth daily.     No current facility-administered medications on file prior to visit.    Allergies:  No Known Allergies Medical History:  She has Intramural leiomyoma of uterus; Ovarian benign neoplasm; Hypertension; H/O dysplastic nevus; Bradycardia; and BMI 25.0-25.9,adult on their problem list. Health Maintenance:   Immunization History  Administered Date(s) Administered  . Influenza-Unspecified 05/17/2016, 05/17/2018  . Td 05/14/2006  . Tdap 06/08/2013   Tetanus: 2014 Pneumovax: N/A Prevnar 13: N/A Flu vaccine: 05/17/2018 at work Shingrix: N/A  LMP: Patient's last menstrual period was 06/16/2016. Pap: 09/2017 MGM: 01/2018 DEXA: N/A Colonoscopy: 08/2013 due in 10 years EGD: N/A  Last Dental Exam: Dr. Clifton James, last exam 2019 Last Eye Exam: Dr. [place], last 05/09/2018, wears glasses Last skin exam: Annually, last visit 05/2018  Patient Care Team: Unk Pinto, MD as PCP - General (Internal Medicine) Pyrtle, Lajuan Lines, MD as Consulting Physician (Gastroenterology) Megan Salon, MD as Consulting Physician (Gynecology) Hillery Jacks, MD as Referring Physician (Dermatology)  Surgical History:  She has a past surgical history that includes Dilatation & currettage/hysteroscopy with resectoscope (N/A, 04/10/2013); Laparoscopic cholecystectomy (09-09-2001  dr Dalbert Batman  @[facility] ); Colonoscopy with propofol (08/21/2013); and Laparoscopic bilateral salpingectomy (Right, 05/16/2018). Family History:  Herfamily history includes Diabetes in her maternal grandmother and mother; Hypertension in her brother, maternal grandfather,  mother, and paternal grandfather; Obesity in her brother; Peripheral vascular disease in her brother; Stroke (age of onset: 79) in her paternal grandmother. Social History:  She reports that she has never smoked. She has never used smokeless tobacco. She reports that she does not drink alcohol or use drugs.  Review of Systems: Review of Systems  Constitutional: Negative for chills, diaphoresis, fever, malaise/fatigue and weight loss.  HENT: Negative for hearing loss and tinnitus.   Eyes: Negative for blurred vision and double vision.  Respiratory: Negative for cough, shortness of breath and wheezing.   Cardiovascular: Negative for chest pain, palpitations, orthopnea, claudication and leg swelling.  Gastrointestinal: Negative for abdominal pain, blood in stool, constipation, diarrhea, heartburn, melena, nausea and vomiting.  Genitourinary: Negative.  Negative for dysuria and urgency.  Musculoskeletal: Negative for joint pain and myalgias.  Skin: Negative for rash.  Neurological: Negative for dizziness, tingling, sensory change, weakness and headaches.  Endo/Heme/Allergies: Negative for polydipsia.  Psychiatric/Behavioral: Negative.  Negative for depression, memory loss and substance abuse. The patient  is not nervous/anxious and does not have insomnia.   All other systems reviewed and are negative.   Physical Exam: Estimated body mass index is 25.95 kg/m as calculated from the following:   Height as of this encounter: 5' 2.25" (1.581 m).   Weight as of this encounter: 143 lb (64.9 kg). BP (!) 132/96   Pulse 68   Temp (!) 97.2 F (36.2 C)   Ht 5' 2.25" (1.581 m)   Wt 143 lb (64.9 kg)   LMP 06/16/2016   SpO2 97%   BMI 25.95 kg/m  General Appearance: Well nourished, in no apparent distress.  Eyes: PERRLA, EOMs, conjunctiva no swelling or erythema, normal fundi and vessels.  Sinuses: No Frontal/maxillary tenderness  ENT/Mouth: Ext aud canals clear, normal light reflex with TMs  without erythema, bulging. Good dentition. No erythema, swelling, or exudate on post pharynx. Tonsils not swollen or erythematous. Hearing normal.  Neck: Supple, thyroid normal. No bruits  Respiratory: Respiratory effort normal, BS equal bilaterally without rales, rhonchi, wheezing or stridor.  Cardio: RRR without murmurs, rubs or gallops. Brisk peripheral pulses without edema.  Chest: symmetric, with normal excursions and percussion.  Breasts: Defer to GYN Abdomen: Soft, nontender, no guarding, rebound, hernias, masses, or organomegaly.  Lymphatics: Non tender without lymphadenopathy.  Genitourinary: Defer to GYN Musculoskeletal: Full ROM all peripheral extremities,5/5 strength, and normal gait. R foot 1st digit MTP joint with some bony enlargement with crepitus, reduced range of motion, without erythema/effusion.  Skin: Warm, dry without rashes, lesions, ecchymosis. Neuro: Cranial nerves intact, reflexes equal bilaterally. Normal muscle tone, no cerebellar symptoms. Sensation intact.  Psych: Awake and oriented X 3, normal affect, Insight and Judgment appropriate.   EKG: no ST changes, sinus bradycardia, IRBBB  Gorden Harms Fatina Sprankle 10:03 AM [facility]

## 2018-06-27 ENCOUNTER — Encounter: Payer: Self-pay | Admitting: Adult Health

## 2018-06-27 ENCOUNTER — Ambulatory Visit (INDEPENDENT_AMBULATORY_CARE_PROVIDER_SITE_OTHER): Payer: No Typology Code available for payment source | Admitting: Adult Health

## 2018-06-27 VITALS — BP 132/96 | HR 68 | Temp 97.2°F | Ht 62.25 in | Wt 143.0 lb

## 2018-06-27 DIAGNOSIS — Z1329 Encounter for screening for other suspected endocrine disorder: Secondary | ICD-10-CM

## 2018-06-27 DIAGNOSIS — Z136 Encounter for screening for cardiovascular disorders: Secondary | ICD-10-CM

## 2018-06-27 DIAGNOSIS — Z86018 Personal history of other benign neoplasm: Secondary | ICD-10-CM

## 2018-06-27 DIAGNOSIS — Z0001 Encounter for general adult medical examination with abnormal findings: Secondary | ICD-10-CM

## 2018-06-27 DIAGNOSIS — R001 Bradycardia, unspecified: Secondary | ICD-10-CM | POA: Diagnosis not present

## 2018-06-27 DIAGNOSIS — Z79899 Other long term (current) drug therapy: Secondary | ICD-10-CM

## 2018-06-27 DIAGNOSIS — Z13220 Encounter for screening for lipoid disorders: Secondary | ICD-10-CM

## 2018-06-27 DIAGNOSIS — D271 Benign neoplasm of left ovary: Secondary | ICD-10-CM

## 2018-06-27 DIAGNOSIS — I10 Essential (primary) hypertension: Secondary | ICD-10-CM | POA: Diagnosis not present

## 2018-06-27 DIAGNOSIS — Z131 Encounter for screening for diabetes mellitus: Secondary | ICD-10-CM

## 2018-06-27 DIAGNOSIS — E559 Vitamin D deficiency, unspecified: Secondary | ICD-10-CM

## 2018-06-27 DIAGNOSIS — Z130 Encounter for screening for diseases of the blood and blood-forming organs and certain disorders involving the immune mechanism: Secondary | ICD-10-CM

## 2018-06-27 DIAGNOSIS — Z6825 Body mass index (BMI) 25.0-25.9, adult: Secondary | ICD-10-CM

## 2018-06-27 DIAGNOSIS — D251 Intramural leiomyoma of uterus: Secondary | ICD-10-CM

## 2018-06-27 MED ORDER — HYDROCHLOROTHIAZIDE 25 MG PO TABS: ORAL_TABLET | ORAL | 1 refills | Status: DC

## 2018-06-27 MED FILL — HYDROCHLOROTHIAZIDE 25 MG T: 25 | 90 days supply | Qty: 90 | Fill #0

## 2018-06-27 NOTE — Patient Instructions (Addendum)
Ms. Nancy Carter , Thank you for taking time to come for your Annual Wellness Visit. I appreciate your ongoing commitment to your health goals. Please review the following plan we discussed and let me know if I can assist you in the future.   These are the goals we discussed: Goals    . Blood Pressure < 120/80    . Weight (lb) < 138 lb (62.6 kg)       This is a list of the screening recommended for you and due dates:  Health Maintenance  Topic Date Due  . Mammogram  02/12/2020  . Pap Smear  10/11/2020  . Tetanus Vaccine  06/09/2023  . Colon Cancer Screening  08/22/2023  . Flu Shot  Completed  .  Hepatitis C: One time screening is recommended by Center for Disease Control  (CDC) for  adults born from [birth year] through 1963.   Completed  . HIV Screening  Completed     Know what a healthy weight is for you (roughly BMI <25) and aim to maintain this  Aim for 7+ servings of fruits and vegetables daily  65-80+ fluid ounces of water or unsweet tea for healthy kidneys  Limit to max 1 drink of alcohol per day; avoid smoking/tobacco  Limit animal fats in diet for cholesterol and heart health - choose grass fed whenever available  Avoid highly processed foods, and foods high in saturated/trans fats  Aim for low stress - take time to unwind and care for your mental health  Aim for 150 min of moderate intensity exercise weekly for heart health, and weights twice weekly for bone health  Aim for 7-9 hours of sleep daily    HYPERTENSION INFORMATION  Monitor your blood pressure at home, please keep a record and bring that in with you to your next office visit.   Go to the ER if any CP, SOB, nausea, dizziness, severe HA, changes vision/speech  Due to a recent study, SPRINT, we have changed our goal for the systolic or top blood pressure number. Ideally we want your top number at 120.  In the [facility] Trial, 5000 people were randomized to a goal BP of 120 and 5000 people were randomized to a goal  BP of less than 140. The patients with the goal BP at 120 had LESS DEMENTIA, LESS HEART ATTACKS, AND LESS STROKES, AS WELL AS OVERALL DECREASED MORTALITY OR DEATH RATE.   If you are willing, our goal BP is the top number of 120.  Your most recent BP: BP: (!) 132/96   Take your medications faithfully as instructed. Maintain a healthy weight. Get at least 150 minutes of aerobic exercise per week. Minimize salt intake. Minimize alcohol intake  DASH Eating Plan DASH stands for "Dietary Approaches to Stop Hypertension." The DASH eating plan is a healthy eating plan that has been shown to reduce high blood pressure (hypertension). Additional health benefits may include reducing the risk of type 2 diabetes mellitus, heart disease, and stroke. The DASH eating plan may also help with weight loss. WHAT DO I NEED TO KNOW ABOUT THE DASH EATING PLAN? For the DASH eating plan, you will follow these general guidelines:  Choose foods with a percent daily value for sodium of less than 5% (as listed on the food label).  Use salt-free seasonings or herbs instead of table salt or sea salt.  Check with your health care provider or pharmacist before using salt substitutes.  Eat lower-sodium products, often labeled as "lower sodium" or "  no salt added."  Eat fresh foods.  Eat more vegetables, fruits, and low-fat dairy products.  Choose whole grains. Look for the word "whole" as the first word in the ingredient list.  Choose fish and skinless chicken or [place] more often than red meat. Limit fish, poultry, and meat to 6 oz (170 g) each day.  Limit sweets, desserts, sugars, and sugary drinks.  Choose heart-healthy fats.  Limit cheese to 1 oz (28 g) per day.  Eat more home-cooked food and less restaurant, buffet, and fast food.  Limit fried foods.  Cook foods using methods other than frying.  Limit canned vegetables. If you do use them, rinse them well to decrease the sodium.  When eating at a  restaurant, ask that your food be prepared with less salt, or no salt if possible. WHAT FOODS CAN I EAT? Seek help from a dietitian for individual calorie needs. Grains Whole grain or whole wheat bread. Brown rice. Whole grain or whole wheat pasta. Quinoa, bulgur, and whole grain cereals. Low-sodium cereals. Corn or whole wheat flour tortillas. Whole grain cornbread. Whole grain crackers. Low-sodium crackers. Vegetables Fresh or frozen vegetables (raw, steamed, roasted, or grilled). Low-sodium or reduced-sodium tomato and vegetable juices. Low-sodium or reduced-sodium tomato sauce and paste. Low-sodium or reduced-sodium canned vegetables.  Fruits All fresh, canned (in natural juice), or frozen fruits. Meat and Other Protein Products Ground beef (85% or leaner), grass-fed beef, or beef trimmed of fat. Skinless chicken or [place]. Ground chicken or [place]. Pork trimmed of fat. All fish and seafood. Eggs. Dried beans, peas, or lentils. Unsalted nuts and seeds. Unsalted canned beans. Dairy Low-fat dairy products, such as skim or 1% milk, 2% or reduced-fat cheeses, low-fat ricotta or cottage cheese, or plain low-fat yogurt. Low-sodium or reduced-sodium cheeses. Fats and Oils Tub margarines without trans fats. Light or reduced-fat mayonnaise and salad dressings (reduced sodium). Avocado. Safflower, olive, or canola oils. Natural peanut or almond butter. Other Unsalted popcorn and pretzels. The items listed above may not be a complete list of recommended foods or beverages. Contact your dietitian for more options. WHAT FOODS ARE NOT RECOMMENDED? Grains White bread. White pasta. White rice. Refined cornbread. Bagels and croissants. Crackers that contain trans fat. Vegetables Creamed or fried vegetables. Vegetables in a cheese sauce. Regular canned vegetables. Regular canned tomato sauce and paste. Regular tomato and vegetable juices. Fruits Dried fruits. Canned fruit in light or heavy syrup. Fruit  juice. Meat and Other Protein Products Fatty cuts of meat. Ribs, chicken wings, bacon, sausage, bologna, salami, chitterlings, fatback, hot dogs, bratwurst, and packaged luncheon meats. Salted nuts and seeds. Canned beans with salt. Dairy Whole or 2% milk, cream, half-and-half, and cream cheese. Whole-fat or sweetened yogurt. Full-fat cheeses or blue cheese. Nondairy creamers and whipped toppings. Processed cheese, cheese spreads, or cheese curds. Condiments Onion and garlic salt, seasoned salt, table salt, and sea salt. Canned and packaged gravies. Worcestershire sauce. Tartar sauce. Barbecue sauce. Teriyaki sauce. Soy sauce, including reduced sodium. Steak sauce. Fish sauce. Oyster sauce. Cocktail sauce. Horseradish. Ketchup and mustard. Meat flavorings and tenderizers. Bouillon cubes. Hot sauce. Tabasco sauce. Marinades. Taco seasonings. Relishes. Fats and Oils Butter, stick margarine, lard, shortening, ghee, and bacon fat. Coconut, palm kernel, or palm oils. Regular salad dressings. Other Pickles and olives. Salted popcorn and pretzels. The items listed above may not be a complete list of foods and beverages to avoid. Contact your dietitian for more information. WHERE CAN I FIND MORE INFORMATION? National Heart, Lung, and Blood Institute:  travelstabloid.com Document Released: 07/23/2011 Document Revised: 12/18/2013 Document Reviewed: 06/07/2013 [facility] Patient Information 2015 [place], [place]. This information is not intended to replace advice given to you by your health care provider. Make sure you discuss any questions you have with your health care provider.

## 2018-06-29 LAB — CBC WITH DIFFERENTIAL/PLATELET
BASOS PCT: 1.3 %
Basophils Absolute: 72 cells/uL (ref 0–200)
EOS PCT: 1.6 %
Eosinophils Absolute: 88 cells/uL (ref 15–500)
HEMATOCRIT: 39.4 % (ref 35.0–45.0)
Hemoglobin: 13.2 g/dL (ref 11.7–15.5)
LYMPHS ABS: 1832 {cells}/uL (ref 850–3900)
MCH: 30.6 pg (ref 27.0–33.0)
MCHC: 33.5 g/dL (ref 32.0–36.0)
MCV: 91.4 fL (ref 80.0–100.0)
MPV: 11.6 fL (ref 7.5–12.5)
Monocytes Relative: 6 %
Neutro Abs: 3179 cells/uL (ref 1500–7800)
Neutrophils Relative %: 57.8 %
PLATELETS: 214 10*3/uL (ref 140–400)
RBC: 4.31 10*6/uL (ref 3.80–5.10)
RDW: 11.7 % (ref 11.0–15.0)
Total Lymphocyte: 33.3 %
WBC: 5.5 10*3/uL (ref 3.8–10.8)
WBCMIX: 330 {cells}/uL (ref 200–950)

## 2018-06-29 LAB — URINALYSIS W MICROSCOPIC + REFLEX CULTURE
Bacteria, UA: NONE SEEN /HPF
Bilirubin Urine: NEGATIVE
Glucose, UA: NEGATIVE
Hyaline Cast: NONE SEEN /LPF
Ketones, ur: NEGATIVE
Nitrites, Initial: NEGATIVE
PH: 7 (ref 5.0–8.0)
Protein, ur: NEGATIVE
RBC / HPF: NONE SEEN /HPF (ref 0–2)
SPECIFIC GRAVITY, URINE: 1.005 (ref 1.001–1.03)
Squamous Epithelial / LPF: NONE SEEN /HPF (ref <=5)

## 2018-06-29 LAB — COMPLETE METABOLIC PANEL WITH GFR
AG RATIO: 1.5 (calc) (ref 1.0–2.5)
ALBUMIN MSPROF: 4.4 g/dL (ref 3.6–5.1)
ALKALINE PHOSPHATASE (APISO): 74 U/L (ref 33–130)
ALT: 16 U/L (ref 6–29)
AST: 29 U/L (ref 10–35)
BILIRUBIN TOTAL: 0.6 mg/dL (ref 0.2–1.2)
BUN: 8 mg/dL (ref 7–25)
CHLORIDE: 101 mmol/L (ref 98–110)
CO2: 29 mmol/L (ref 20–32)
Calcium: 9.7 mg/dL (ref 8.6–10.4)
Creat: 0.86 mg/dL (ref 0.50–1.05)
GFR, Est African American: 88 mL/min/{1.73_m2} (ref >=60)
GFR, Est Non African American: 76 mL/min/{1.73_m2} (ref >=60)
GLOBULIN: 3 g/dL (ref 1.9–3.7)
Glucose, Bld: 80 mg/dL (ref 65–99)
POTASSIUM: 4.5 mmol/L (ref 3.5–5.3)
SODIUM: 139 mmol/L (ref 135–146)
Total Protein: 7.4 g/dL (ref 6.1–8.1)

## 2018-06-29 LAB — URINE CULTURE
MICRO NUMBER: 91360700
SPECIMEN QUALITY:: ADEQUATE

## 2018-06-29 LAB — VITAMIN B12: Vitamin B-12: 674 pg/mL (ref 200–1100)

## 2018-06-29 LAB — CULTURE INDICATED

## 2018-06-29 LAB — MAGNESIUM: MAGNESIUM: 2 mg/dL (ref 1.5–2.5)

## 2018-06-29 LAB — HEMOGLOBIN A1C
HEMOGLOBIN A1C: 5.6 %{Hb} (ref <5.7)
MEAN PLASMA GLUCOSE: 114 (calc)
eAG (mmol/L): 6.3 (calc)

## 2018-06-29 LAB — MICROALBUMIN / CREATININE URINE RATIO
CREATININE, URINE: 27 mg/dL (ref 20–275)
Microalb Creat Ratio: 11 mcg/mg creat (ref <30)
Microalb, Ur: 0.3 mg/dL

## 2018-06-29 LAB — LIPID PANEL
CHOLESTEROL: 176 mg/dL (ref <200)
HDL: 68 mg/dL (ref >50)
LDL CHOLESTEROL (CALC): 93 mg/dL
Non-HDL Cholesterol (Calc): 108 mg/dL (calc) (ref <130)
Total CHOL/HDL Ratio: 2.6 (calc) (ref <5.0)
Triglycerides: 67 mg/dL (ref <150)

## 2018-06-29 LAB — VITAMIN D 25 HYDROXY (VIT D DEFICIENCY, FRACTURES): Vit D, 25-Hydroxy: 41 ng/mL (ref 30–100)

## 2018-06-29 LAB — TSH: TSH: 1.13 mIU/L

## 2018-08-31 MED FILL — PREVIDENT 5000 BOOSTER PLUS: 1.1 | 30 days supply | Qty: 100 | Fill #0

## 2018-10-03 NOTE — Progress Notes (Signed)
FOLLOW UP  Assessment and Plan:   Hypertension Well controlled with current medications; continue HCTZ 25 mg daily  Monitor blood pressure at home; patient to call if consistently greater than 130/80 Continue DASH diet.   Reminder to go to the ER if any CP, SOB, nausea, dizziness, severe HA, changes vision/speech, left arm numbness and tingling and jaw pain.  Bradycardia Denies any dizziness, fatigue, exercise intolerance Monitor, avoid rate controlling drugs  Overweight, BMI 25 Commended on weight loss progress, very nearly at goal  Long discussion about weight loss, diet, and exercise Recommended diet heavy in fruits and veggies and low in animal meats, cheeses, and dairy products, appropriate calorie intake Discussed appropriate weight for height (138lb) Follow up at next visit  Vitamin D Def Below goal at last visit; She has started supplement, taking 1000 IU daily continue supplementation to maintain goal of 60-100 Defer Vit D level to CPE  Asymptomatic microscopic hematuria Asymptomatic microscopic hematuria on routine screening UAs for several years; she does not have hx of renal calculi, no personal history of smoking or exposure to second hand smoke, no personal or family history of cancer. Discussed potential causes of persistent microscopic hematuria include asymptomatic renal calculi, but also bladder cancer. Discussed imaging vs urology referral for evaluation.  She requests urology referral which was placed today.   Continue diet and meds as discussed. Further disposition pending results of labs. Discussed med's effects and SE's.   Over 30 minutes of exam, counseling, chart review, and critical decision making was performed.   Future Appointments  Date Time Provider [place]  12/30/2018  9:15 AM Megan Salon, MD [place] None  07/03/2019  9:00 AM Liane Comber, NP [facility] None     ----------------------------------------------------------------------------------------------------------------------  HPI BP 124/80   Pulse (!) 54   Temp 97.7 F (36.5 C)   Ht 5' 2.25" (1.581 m)   Wt 138 lb 9.6 oz (62.9 kg)   LMP 06/16/2016   SpO2 99%   BMI 25.15 kg/m   58 y.o. female  presents for 3 month follow up on hypertension, weight and vitamin D deficiency. She has been followed  by Dr. 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   BMI is Body mass index is 25.15 kg/m., she has been working on diet and exercise, cutting down on snacking, walking on days that she doesn't work, 2-3 miles 3 days a week.  Wt Readings from Last 3 Encounters:  10/04/18 138 lb 9.6 oz (62.9 kg)  06/27/18 143 lb (64.9 kg)  05/24/18 140 lb 12.8 oz (63.9 kg)   She is newly on HCTZ, taking 25 mg since last visit.  Her blood pressure has been controlled at home (120s/80s), today their BP is BP: 124/80  She does workout. She denies chest pain, shortness of breath, dizziness.  Patient is newly on Vitamin D supplement, taking 1000 IU daily   Lab Results  Component Value Date   VD25OH 33 06/27/2018       Current Medications:  Current Outpatient Medications on File Prior to Visit  Medication Sig  . hydrochlorothiazide (HYDRODIURIL) 25 MG tablet Take 1/2-1 tablet daily for BP and fluid  . Multiple Vitamin (MULTIVITAMIN) tablet Take 1 tablet by mouth daily.   No current facility-administered medications on file prior to visit.      Allergies: No Known Allergies   Medical History:  Past Medical History:  Diagnosis Date  . History of anemia   . Ovarian neoplasm    right side  . Wears glasses    Family history- Reviewed and unchanged Social history- Reviewed and unchanged   Review of Systems:  Review of Systems  Constitutional: Negative for malaise/fatigue and weight  loss.  HENT: Negative for hearing loss and tinnitus.   Eyes: Negative for blurred vision and double vision.  Respiratory: Negative for cough, shortness of breath and wheezing.   Cardiovascular: Negative for chest pain, palpitations, orthopnea, claudication and leg swelling.  Gastrointestinal: Negative for abdominal pain, blood in stool, constipation, diarrhea, heartburn, melena, nausea and vomiting.  Genitourinary: Negative.   Musculoskeletal: Negative for joint pain and myalgias.  Skin: Negative for rash.  Neurological: Negative for dizziness, tingling, sensory change, weakness and headaches.  Endo/Heme/Allergies: Negative for polydipsia.  Psychiatric/Behavioral: Negative.   All other systems reviewed and are negative.    Physical Exam: BP 124/80   Pulse (!) 54   Temp 97.7 F (36.5 C)   Ht 5' 2.25" (1.581 m)   Wt 138 lb 9.6 oz (62.9 kg)   LMP 06/16/2016   SpO2 99%   BMI 25.15 kg/m  Wt Readings from Last 3 Encounters:  10/04/18 138 lb 9.6 oz (62.9 kg)  06/27/18 143 lb (64.9 kg)  05/24/18 140 lb 12.8 oz (63.9 kg)   General Appearance: Well nourished, in no apparent distress. Eyes: PERRLA, EOMs, conjunctiva no swelling or erythema Sinuses: No Frontal/maxillary tenderness ENT/Mouth: Ext aud canals clear, TMs without erythema, bulging. No erythema, swelling, or exudate on post pharynx.  Tonsils not swollen or erythematous. Hearing normal.  Neck: Supple, thyroid normal.  Respiratory: Respiratory effort normal, BS equal bilaterally without rales, rhonchi, wheezing or stridor.  Cardio: RRR with no MRGs. Brisk peripheral pulses without edema.  Abdomen: Soft, + BS.  Non tender, no guarding, rebound, hernias, masses. Lymphatics: Non tender without lymphadenopathy.  Musculoskeletal: Full ROM, 5/5 strength, Normal gait Skin: Warm, dry without rashes, lesions, ecchymosis.  Neuro: Cranial nerves intact. No cerebellar symptoms.  Psych: Awake and oriented X 3, normal affect, Insight and  Judgment appropriate.    Izora Ribas, NP 10:59 AM [facility]

## 2018-10-04 ENCOUNTER — Encounter: Payer: Self-pay | Admitting: Adult Health

## 2018-10-04 ENCOUNTER — Ambulatory Visit (INDEPENDENT_AMBULATORY_CARE_PROVIDER_SITE_OTHER): Payer: No Typology Code available for payment source | Admitting: Adult Health

## 2018-10-04 VITALS — BP 124/80 | HR 54 | Temp 97.7°F | Ht 62.25 in | Wt 138.6 lb

## 2018-10-04 DIAGNOSIS — I10 Essential (primary) hypertension: Secondary | ICD-10-CM

## 2018-10-04 DIAGNOSIS — R001 Bradycardia, unspecified: Secondary | ICD-10-CM

## 2018-10-04 DIAGNOSIS — R3121 Asymptomatic microscopic hematuria: Secondary | ICD-10-CM | POA: Insufficient documentation

## 2018-10-04 DIAGNOSIS — Z6825 Body mass index (BMI) 25.0-25.9, adult: Secondary | ICD-10-CM

## 2018-10-04 DIAGNOSIS — Z79899 Other long term (current) drug therapy: Secondary | ICD-10-CM

## 2018-10-04 LAB — BASIC METABOLIC PANEL WITH GFR
BUN: 9 mg/dL (ref 7–25)
CHLORIDE: 100 mmol/L (ref 98–110)
CO2: 30 mmol/L (ref 20–32)
Calcium: 10.3 mg/dL (ref 8.6–10.4)
Creat: 0.84 mg/dL (ref 0.50–1.05)
GFR, Est African American: 91 mL/min/{1.73_m2} (ref >=60)
GFR, Est Non African American: 78 mL/min/{1.73_m2} (ref >=60)
Glucose, Bld: 75 mg/dL (ref 65–99)
Potassium: 4.1 mmol/L (ref 3.5–5.3)
SODIUM: 140 mmol/L (ref 135–146)

## 2018-10-04 NOTE — Patient Instructions (Addendum)
Goals    . Blood Pressure < 120/80    . Weight (lb) < 138 lb (62.6 kg)      Great job hitting your weight loss goal!   Hematuria, Adult Hematuria is blood in the urine. Blood may be visible in the urine, or it may be identified with a test. This condition can be caused by infections of the bladder, urethra, kidney, or prostate. Other possible causes include:  Kidney stones.  Cancer of the urinary tract.  Too much calcium in the urine.  Conditions that are passed from parent to child (inherited conditions).  Exercise that requires a lot of energy. Infections can usually be treated with medicine, and a kidney stone usually will pass through your urine. If neither of these is the cause of your hematuria, more tests may be needed to identify the cause of your symptoms. It is very important to tell your health care provider about any blood in your urine, even if it is painless or the blood stops without treatment. Blood in the urine, when it happens and then stops and then happens again, can be a symptom of a very serious condition, including cancer. There is no pain in the initial stages of many urinary cancers. Follow these instructions at home: Medicines  Take over-the-counter and prescription medicines only as told by your health care provider.  If you were prescribed an antibiotic medicine, take it as told by your health care provider. Do not stop taking the antibiotic even if you start to feel better. Eating and drinking  Drink enough fluid to keep your urine clear or pale yellow. It is recommended that you drink 3-4 quarts (2.8-3.8 L) a day. If you have been diagnosed with an infection, it is recommended that you drink cranberry juice in addition to large amounts of water.  Avoid caffeine, tea, and carbonated beverages. These tend to irritate the bladder.  Avoid alcohol because it may irritate the prostate (men). General instructions  If you have been diagnosed with a  kidney stone, follow your health care provider's instructions about straining your urine to catch the stone.  Empty your bladder often. Avoid holding urine for long periods of time.  If you are female: ? After a bowel movement, wipe from front to back and use each piece of toilet paper only once. ? Empty your bladder before and after sex.  Pay attention to any changes in your symptoms. Tell your health care provider about any changes or any new symptoms.  It is your responsibility to get your test results. Ask your health care provider, or the department performing the test, when your results will be ready.  Keep all follow-up visits as told by your health care provider. This is important. Contact a health care provider if:  You develop back pain.  You have a fever.  You have nausea or vomiting.  Your symptoms do not improve after 3 days.  Your symptoms get worse. Get help right away if:  You develop severe vomiting and are unable take medicine without vomiting.  You develop severe pain in your back or abdomen even though you are taking medicine.  You pass a large amount of blood in your urine.  You pass blood clots in your urine.  You feel very weak or like you might faint.  You faint. Summary  Hematuria is blood in the urine. It has many possible causes.  It is very important that you tell your health care provider about any  blood in your urine, even if it is painless or the blood stops without treatment.  Take over-the-counter and prescription medicines only as told by your health care provider.  Drink enough fluid to keep your urine clear or pale yellow. This information is not intended to replace advice given to you by your health care provider. Make sure you discuss any questions you have with your health care provider. Document Released: 08/03/2005 Document Revised: 09/05/2016 Document Reviewed: 09/05/2016 Elsevier Interactive Patient Education  2019 [employer].

## 2018-10-14 MED FILL — HYDROCHLOROTHIAZIDE 25 MG T: 25 | 90 days supply | Qty: 90 | Fill #1

## 2018-12-30 ENCOUNTER — Ambulatory Visit: Payer: No Typology Code available for payment source | Admitting: Obstetrics & Gynecology

## 2019-03-14 ENCOUNTER — Other Ambulatory Visit: Payer: Self-pay | Admitting: Obstetrics & Gynecology

## 2019-03-14 DIAGNOSIS — Z1231 Encounter for screening mammogram for malignant neoplasm of breast: Secondary | ICD-10-CM

## 2019-04-28 ENCOUNTER — Ambulatory Visit
Admission: RE | Admit: 2019-04-28 | Discharge: 2019-04-28 | Disposition: A | Discharge status: Home / Self Care | Payer: No Typology Code available for payment source | Source: Ambulatory Visit | Attending: Obstetrics & Gynecology | Admitting: Obstetrics & Gynecology

## 2019-04-28 ENCOUNTER — Other Ambulatory Visit: Payer: Self-pay

## 2019-04-28 DIAGNOSIS — Z1231 Encounter for screening mammogram for malignant neoplasm of breast: Secondary | ICD-10-CM

## 2019-05-23 ENCOUNTER — Other Ambulatory Visit: Payer: Self-pay | Admitting: Adult Health

## 2019-05-23 DIAGNOSIS — I10 Essential (primary) hypertension: Secondary | ICD-10-CM

## 2019-05-26 MED FILL — OMRON 3 SERIES BP MONITOR D: 90 days supply | Qty: 1 | Fill #0

## 2019-05-31 ENCOUNTER — Encounter: Payer: Self-pay | Admitting: Adult Health

## 2019-05-31 ENCOUNTER — Other Ambulatory Visit: Payer: Self-pay

## 2019-05-31 ENCOUNTER — Ambulatory Visit (INDEPENDENT_AMBULATORY_CARE_PROVIDER_SITE_OTHER): Payer: No Typology Code available for payment source | Admitting: Adult Health

## 2019-05-31 VITALS — BP 160/90 | HR 59 | Temp 97.7°F | Ht 62.25 in | Wt 136.0 lb

## 2019-05-31 DIAGNOSIS — Z79899 Other long term (current) drug therapy: Secondary | ICD-10-CM

## 2019-05-31 DIAGNOSIS — I10 Essential (primary) hypertension: Secondary | ICD-10-CM

## 2019-05-31 MED ORDER — LOSARTAN POTASSIUM 100 MG PO TABS: ORAL_TABLET | ORAL | 1 refills | Status: DC

## 2019-05-31 MED FILL — LOSARTAN POTASSIUM 100 MG T: 100 | 90 days supply | Qty: 90 | Fill #0

## 2019-05-31 NOTE — Progress Notes (Signed)
Assessment and Plan:  Nancy Carter was seen today for hypertension.  Diagnoses and all orders for this visit:  Hypertension, unspecified type Historically well controlled, suddenly trending up without good explanation Initiate medication: losartan 50-100 mg daily, continue hctz 25 mg Checking basic labs today for changes  She is an Therapist, sports, checking regularly; start with 1/2 tab losartan daily x 1 week, then if persistently above goal increase to whole tab Message back with results in 2 weeks; if unchanged/worse will consider renal artery doppler Monitor blood pressure at home; message sooner if consistently over 150/90 Discussed DASH diet Advised to go to the ER if any CP, SOB, nausea, dizziness, severe HA, changes vision/speech, left arm numbness and tingling and jaw pain. Follow up in 4 weeks as scheduled -     COMPLETE METABOLIC PANEL WITH GFR -     Magnesium -     TSH -     CBC with Differential/Platelet -     losartan (COZAAR) 100 MG tablet; Take 1/2-1 tab daily at night for blood pressure goal of <130/80.  Further disposition pending results of labs. Discussed med's effects and SE's.   Over 30 minutes of exam, counseling, chart review, and critical decision making was performed.   Future Appointments  Date Time Provider [place]  07/03/2019  9:00 AM Liane Comber, NP [facility] None  07/03/2019  1:30 PM Megan Salon, MD [place] None    ------------------------------------------------------------------------------------------------------------------   HPI BP (!) 160/90   Pulse (!) 59   Temp 97.7 F (36.5 C)   Ht 5' 2.25" (1.581 m)   Wt 136 lb (61.7 kg)   LMP 06/16/2016   SpO2 92%   BMI 24.68 kg/m   58 y.o.female presents for evaluation of elevated blood pressure; historically mild elevations and well controlled with low dose HCTZ but trending up in the past year.   She reports she presented 2 weeks ago for a dental procedure was cancelled due to BP of 161/90;  she increased hctz to 25 mg and presented at rescheduled date but was not improved 167/110, 169/97. She does have BP cuff, new, has been consistently 130/80s. No hx of white coat syndrome, has been well controlled in our office for many years.   No new recent medications; not taking any OTC decongestants or allergy medications.   Today their BP is BP: (!) 160/90  She does workout.   She denies HA, blurry vision, dizziness, CP, dyspnea, palpitations, anxiety, heat intolerance, diarrhea, diaphoresis/night sweats, anxiety, unintended weight loss. She denies decreased urine output, urine character changes.   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   Lab Results  Component Value Date   TSH 1.13 06/27/2018   Renal functions have been stable in stage 2 CKD for several years.  Lab Results  Component Value Date   GFRNONAA 78 10/04/2018   Lab Results  Component Value Date   CREATININE 0.84 10/04/2018    BMI is Body mass index is 24.68 kg/m., she has been working on diet and exercise. Wt Readings from Last 3 Encounters:  05/31/19 136 lb (61.7 kg)  10/04/18 138 lb 9.6 oz (62.9 kg)  06/27/18 143 lb (64.9 kg)    Past Medical History:  Diagnosis Date  . History of anemia   . Ovarian neoplasm    right side  . Wears glasses      No Known Allergies  Current Outpatient Medications on File Prior to Visit  Medication Sig  . CHOLECALCIFEROL PO Take 1,000 Units by mouth daily.  . hydrochlorothiazide (HYDRODIURIL) 25 MG tablet TAKE 1/2 TO 1 TABLET BY MOUTH DAILY FOR BLOOD PRESSURE AND FLUID  . Multiple Vitamin (MULTIVITAMIN) tablet Take 1 tablet by mouth daily.   No current facility-administered medications on file prior to visit.     ROS: all negative except above.   Physical Exam:  BP (!) 160/90   Pulse (!) 59   Temp 97.7 F (36.5 C)   Ht 5' 2.25" (1.581 m)   Wt 136 lb (61.7 kg)   LMP 06/16/2016   SpO2 92%   BMI 24.68  kg/m   General Appearance: Well nourished, in no apparent distress. Eyes: PERRLA, conjunctiva no swelling or erythema ENT/Mouth: No erythema, swelling, or exudate on post pharynx.  Tonsils not swollen or erythematous. Hearing normal.  Neck: Supple, thyroid normal.  Respiratory: Respiratory effort normal, BS equal bilaterally without rales, rhonchi, wheezing or stridor.  Cardio: RRR with no MRGs. Brisk peripheral pulses without edema. No carotid, aortic, renal artery bruits audible  Abdomen: Soft, + BS.  Non tender, no guarding, rebound, hernias, masses. Lymphatics: Non tender without lymphadenopathy.  Musculoskeletal: No obvious deformity, normal gait.  Skin: Warm, dry without rashes, lesions, ecchymosis.  Neuro: Cranial nerves intact. Normal muscle tone Psych: Awake and oriented X 3, normal affect, Insight and Judgment appropriate.     Izora Ribas, NP 4:34 PM [facility]

## 2019-05-31 NOTE — Patient Instructions (Addendum)
Goals    . Blood Pressure < 120/80    . Weight (lb) < 138 lb (62.6 kg)        Add 1/2 tab losartan (50 mg) at night x 1 week; if consistently at goal <130/80, stick with this dose; if intermittently or persistently elevated, increase to whole tab in 1 week  Message me in 2 weeks (around 10/28) with how your blood pressures are doing   If WORSE or not improved will order a renal artery ultrasound    HYPERTENSION INFORMATION  Monitor your blood pressure at home, please keep a record and bring that in with you to your next office visit.   Go to the ER if any CP, SOB, nausea, dizziness, severe HA, changes vision/speech  Testing/Procedures: HOW TO TAKE YOUR BLOOD PRESSURE:  Rest 5 minutes before taking your blood pressure.  Don't smoke or drink caffeinated beverages for at least 30 minutes before.  Take your blood pressure before (not after) you eat.  Sit comfortably with your back supported and both feet on the floor (don't cross your legs).  Elevate your arm to heart level on a table or a desk.  Use the proper sized cuff. It should fit smoothly and snugly around your bare upper arm. There should be enough room to slip a fingertip under the cuff. The bottom edge of the cuff should be 1 inch above the crease of the elbow.  There was a study that showed taking your blood pressure medications at night decrease cardiovascular events.  However if you are on a fluid pill (hydrochlorothiazide), please take this in the morning.   Your most recent BP: BP: (!) 160/90   Take your medications faithfully as instructed. Maintain a healthy weight. Get at least 150 minutes of aerobic exercise per week. Minimize salt intake. Minimize alcohol intake  DASH Eating Plan DASH stands for "Dietary Approaches to Stop Hypertension." The DASH eating plan is a healthy eating plan that has been shown to reduce high blood pressure (hypertension). Additional health benefits may include reducing the risk  of type 2 diabetes mellitus, heart disease, and stroke. The DASH eating plan may also help with weight loss. WHAT DO I NEED TO KNOW ABOUT THE DASH EATING PLAN? For the DASH eating plan, you will follow these general guidelines:  Choose foods with a percent daily value for sodium of less than 5% (as listed on the food label).  Use salt-free seasonings or herbs instead of table salt or sea salt.  Check with your health care provider or pharmacist before using salt substitutes.  Eat lower-sodium products, often labeled as "lower sodium" or "no salt added."  Eat fresh foods.  Eat more vegetables, fruits, and low-fat dairy products.  Choose whole grains. Look for the word "whole" as the first word in the ingredient list.  Choose fish and skinless chicken or [place] more often than red meat. Limit fish, poultry, and meat to 6 oz (170 g) each day.  Limit sweets, desserts, sugars, and sugary drinks.  Choose heart-healthy fats.  Limit cheese to 1 oz (28 g) per day.  Eat more home-cooked food and less restaurant, buffet, and fast food.  Limit fried foods.  Cook foods using methods other than frying.  Limit canned vegetables. If you do use them, rinse them well to decrease the sodium.  When eating at a restaurant, ask that your food be prepared with less salt, or no salt if possible. WHAT FOODS CAN I EAT? Seek help from  a dietitian for individual calorie needs. Grains Whole grain or whole wheat bread. Brown rice. Whole grain or whole wheat pasta. Quinoa, bulgur, and whole grain cereals. Low-sodium cereals. Corn or whole wheat flour tortillas. Whole grain cornbread. Whole grain crackers. Low-sodium crackers. Vegetables Fresh or frozen vegetables (raw, steamed, roasted, or grilled). Low-sodium or reduced-sodium tomato and vegetable juices. Low-sodium or reduced-sodium tomato sauce and paste. Low-sodium or reduced-sodium canned vegetables.  Fruits All fresh, canned (in natural juice), or  frozen fruits. Meat and Other Protein Products Ground beef (85% or leaner), grass-fed beef, or beef trimmed of fat. Skinless chicken or [place]. Ground chicken or [place]. Pork trimmed of fat. All fish and seafood. Eggs. Dried beans, peas, or lentils. Unsalted nuts and seeds. Unsalted canned beans. Dairy Low-fat dairy products, such as skim or 1% milk, 2% or reduced-fat cheeses, low-fat ricotta or cottage cheese, or plain low-fat yogurt. Low-sodium or reduced-sodium cheeses. Fats and Oils Tub margarines without trans fats. Light or reduced-fat mayonnaise and salad dressings (reduced sodium). Avocado. Safflower, olive, or canola oils. Natural peanut or almond butter. Other Unsalted popcorn and pretzels. The items listed above may not be a complete list of recommended foods or beverages. Contact your dietitian for more options. WHAT FOODS ARE NOT RECOMMENDED? Grains White bread. White pasta. White rice. Refined cornbread. Bagels and croissants. Crackers that contain trans fat. Vegetables Creamed or fried vegetables. Vegetables in a cheese sauce. Regular canned vegetables. Regular canned tomato sauce and paste. Regular tomato and vegetable juices. Fruits Dried fruits. Canned fruit in light or heavy syrup. Fruit juice. Meat and Other Protein Products Fatty cuts of meat. Ribs, chicken wings, bacon, sausage, bologna, salami, chitterlings, fatback, hot dogs, bratwurst, and packaged luncheon meats. Salted nuts and seeds. Canned beans with salt. Dairy Whole or 2% milk, cream, half-and-half, and cream cheese. Whole-fat or sweetened yogurt. Full-fat cheeses or blue cheese. Nondairy creamers and whipped toppings. Processed cheese, cheese spreads, or cheese curds. Condiments Onion and garlic salt, seasoned salt, table salt, and sea salt. Canned and packaged gravies. Worcestershire sauce. Tartar sauce. Barbecue sauce. Teriyaki sauce. Soy sauce, including reduced sodium. Steak sauce. Fish sauce. Oyster sauce.  Cocktail sauce. Horseradish. Ketchup and mustard. Meat flavorings and tenderizers. Bouillon cubes. Hot sauce. Tabasco sauce. Marinades. Taco seasonings. Relishes. Fats and Oils Butter, stick margarine, lard, shortening, ghee, and bacon fat. Coconut, palm kernel, or palm oils. Regular salad dressings. Other Pickles and olives. Salted popcorn and pretzels. The items listed above may not be a complete list of foods and beverages to avoid. Contact your dietitian for more information. WHERE CAN I FIND MORE INFORMATION? National Heart, Lung, and Blood Institute: travelstabloid.com Document Released: 07/23/2011 Document Revised: 12/18/2013 Document Reviewed: 06/07/2013 [facility] Patient Information 2015 [place], [place]. This information is not intended to replace advice given to you by your health care provider. Make sure you discuss any questions you have with your health care provider.     Losartan tablets What is this medicine? LOSARTAN (loe SAR tan) is used to treat high blood pressure and to reduce the risk of stroke in certain patients. This drug also slows the progression of kidney disease in patients with diabetes. This medicine may be used for other purposes; ask your health care provider or pharmacist if you have questions. COMMON BRAND NAME(S): Cozaar What should I tell my health care provider before I take this medicine? They need to know if you have any of these conditions:  heart failure  kidney or liver disease  an unusual or  allergic reaction to losartan, other medicines, foods, dyes, or preservatives  pregnant or trying to get pregnant  breast-feeding How should I use this medicine? Take this medicine by mouth with a glass of water. Follow the directions on the prescription label. This medicine can be taken with or without food. Take your doses at regular intervals. Do not take your medicine more often than directed. Talk to your pediatrician  regarding the use of this medicine in children. Special care may be needed. Overdosage: If you think you have taken too much of this medicine contact a poison control center or emergency room at once. NOTE: This medicine is only for you. Do not share this medicine with others. What if I miss a dose? If you miss a dose, take it as soon as you can. If it is almost time for your next dose, take only that dose. Do not take double or extra doses. What may interact with this medicine?  blood pressure medicines  diuretics, especially triamterene, spironolactone, or amiloride  fluconazole  NSAIDs, medicines for pain and inflammation, like ibuprofen or naproxen  potassium salts or potassium supplements  rifampin This list may not describe all possible interactions. Give your health care provider a list of all the medicines, herbs, non-prescription drugs, or dietary supplements you use. Also tell them if you smoke, drink alcohol, or use illegal drugs. Some items may interact with your medicine. What should I watch for while using this medicine? Visit your doctor or health care professional for regular checks on your progress. Check your blood pressure as directed. Ask your doctor or health care professional what your blood pressure should be and when you should contact him or her. Call your doctor or health care professional if you notice an irregular or fast heart beat. Women should inform their doctor if they wish to become pregnant or think they might be pregnant. There is a potential for serious side effects to an unborn child, particularly in the second or third trimester. Talk to your health care professional or pharmacist for more information. You may get drowsy or dizzy. Do not drive, use machinery, or do anything that needs mental alertness until you know how this drug affects you. Do not stand or sit up quickly, especially if you are an older patient. This reduces the risk of dizzy or fainting  spells. Alcohol can make you more drowsy and dizzy. Avoid alcoholic drinks. Avoid salt substitutes unless you are told otherwise by your doctor or health care professional. Do not treat yourself for coughs, colds, or pain while you are taking this medicine without asking your doctor or health care professional for advice. Some ingredients may increase your blood pressure. What side effects may I notice from receiving this medicine? Side effects that you should report to your doctor or health care professional as soon as possible:  confusion, dizziness, light headedness or fainting spells  decreased amount of urine passed  difficulty breathing or swallowing, hoarseness, or tightening of the throat  fast or irregular heart beat, palpitations, or chest pain  skin rash, itching  swelling of your face, lips, tongue, hands, or feet Side effects that usually do not require medical attention (report to your doctor or health care professional if they continue or are bothersome):  cough  decreased sexual function or desire  headache  nasal congestion or stuffiness  nausea or stomach pain  sore or cramping muscles This list may not describe all possible side effects. Call your doctor for medical  advice about side effects. You may report side effects to FDA at 1-800-FDA-1088. Where should I keep my medicine? Keep out of the reach of children. Store at room temperature between 15 and 30 degrees C (59 and 86 degrees F). Protect from light. Keep container tightly closed. Throw away any unused medicine after the expiration date. NOTE: This sheet is a summary. It may not cover all possible information. If you have questions about this medicine, talk to your doctor, pharmacist, or health care provider.  2020 Elsevier/Gold Standard (2007-10-14 16:42:18)

## 2019-06-01 LAB — CBC WITH DIFFERENTIAL/PLATELET
Absolute Monocytes: 322 cells/uL (ref 200–950)
Basophils Absolute: 80 cells/uL (ref 0–200)
Basophils Relative: 1.2 %
Eosinophils Absolute: 60 cells/uL (ref 15–500)
Eosinophils Relative: 0.9 %
HCT: 41.5 % (ref 35.0–45.0)
Hemoglobin: 14 g/dL (ref 11.7–15.5)
Lymphs Abs: 2312 cells/uL (ref 850–3900)
MCH: 30.1 pg (ref 27.0–33.0)
MCHC: 33.7 g/dL (ref 32.0–36.0)
MCV: 89.2 fL (ref 80.0–100.0)
MPV: 11.3 fL (ref 7.5–12.5)
Monocytes Relative: 4.8 %
Neutro Abs: 3926 cells/uL (ref 1500–7800)
Neutrophils Relative %: 58.6 %
Platelets: 237 10*3/uL (ref 140–400)
RBC: 4.65 10*6/uL (ref 3.80–5.10)
RDW: 11.7 % (ref 11.0–15.0)
Total Lymphocyte: 34.5 %
WBC: 6.7 10*3/uL (ref 3.8–10.8)

## 2019-06-01 LAB — TSH: TSH: 0.77 mIU/L (ref 0.40–4.50)

## 2019-06-01 LAB — COMPLETE METABOLIC PANEL WITH GFR
AG Ratio: 1.5 (calc) (ref 1.0–2.5)
ALT: 11 U/L (ref 6–29)
AST: 18 U/L (ref 10–35)
Albumin: 4.5 g/dL (ref 3.6–5.1)
Alkaline phosphatase (APISO): 77 U/L (ref 37–153)
BUN: 9 mg/dL (ref 7–25)
CO2: 29 mmol/L (ref 20–32)
Calcium: 10 mg/dL (ref 8.6–10.4)
Chloride: 95 mmol/L — ABNORMAL LOW (ref 98–110)
Creat: 0.8 mg/dL (ref 0.50–1.05)
GFR, Est African American: 96 mL/min/{1.73_m2} (ref >=60)
GFR, Est Non African American: 82 mL/min/{1.73_m2} (ref >=60)
Globulin: 3 g/dL (calc) (ref 1.9–3.7)
Glucose, Bld: 90 mg/dL (ref 65–99)
Potassium: 3.8 mmol/L (ref 3.5–5.3)
Sodium: 136 mmol/L (ref 135–146)
Total Bilirubin: 0.8 mg/dL (ref 0.2–1.2)
Total Protein: 7.5 g/dL (ref 6.1–8.1)

## 2019-06-01 LAB — MAGNESIUM: Magnesium: 1.8 mg/dL (ref 1.5–2.5)

## 2019-06-15 ENCOUNTER — Other Ambulatory Visit: Payer: Self-pay | Admitting: Adult Health

## 2019-06-15 MED FILL — KETOCONAZOLE 2% CREAM: 2 | 25 days supply | Qty: 60 | Fill #0

## 2019-06-29 ENCOUNTER — Other Ambulatory Visit: Payer: Self-pay

## 2019-06-29 NOTE — Progress Notes (Signed)
Complete Physical  Assessment and Plan:  Nancy Carter was seen today for annual exam   Diagnoses and all orders for this visit:  Encounter for routine adult health examination with abnormal findings  Essential hypertension Continue medications; at goal; patient is RN and monitors closely  Monitor blood pressure at home; call if consistently over 130/80 Discussed DASH diet Advised to go to the ER if any CP, SOB, nausea, dizziness, severe HA, changes vision/speech, left arm numbness and tingling and jaw pain. Follow up in 6 months  Intramural leiomyoma of uterus      -       Continue f/u with GYN as recommended  BMI 24 Continue to recommend diet heavy in fruits and veggies and low in animal meats, cheeses, and dairy products, appropriate calorie intake Discuss exercise recommendations routinely Continue to monitor weight at each visit  Benign microscopic hematuria Has seen urology without further workup recommended Low risk hx Continue to monitor  UA, microalbumin  Screening cholesterol level -     Lipid panel  Medication management -     CBC with Differential/Platelet -     CMP/GFR  Screening for diabetes mellitus/ hx of prediabetes Discussed diet/exercise, weight management  -     Hemoglobin A1c  Screening for thyroid disorder -     TSH  Vitamin D deficiency -     VITAMIN D 25 Hydroxy (Vit-D Deficiency, Fractures)  H/O dysplastic nevus      -      No concerning areas today; continue follow up with derm; encouraged sunscreen use  Bradycardia       -     Without dizziness, orthostatic symptoms; patient to measure pulse response with exercise, notify of any dizziness, CP, SOB, syncopy with exercise or sudden position changes.        -     EKG 12-Lead  Discussed med's effects and SE's. Screening labs and tests as requested with regular follow-up as recommended. Over 40 minutes of exam, counseling, chart review, and complex, high level critical decision making was  performed this visit.   Future Appointments  Date Time Provider [place]  07/03/2019  1:30 PM Megan Salon, MD [facility] None  07/02/2020  9:00 AM Liane Comber, NP [facility] None    HPI  58 y.o. Caucasian female, presents for a complete physical and follow up for has Intramural leiomyoma of uterus; Hypertension; H/O dysplastic nevus; Bradycardia; BMI 25.0-25.9,adult; and Asymptomatic microscopic hematuria on their problem list.   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   She has no concerns today.   She has been followed  by Dr. 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   She sees dermatology Dr. Carolynn Sayers, NP annually for history of dysplastic nevus.   She has had ongoing asymptomatic microscopic hematuria for which she saw urology and was not recommended workup.   BMI is Body mass index is 24.82 kg/m., she has been working on diet and exercise, walking twice weekly, drinks ~ 1 gallon unsweet tea daily, she does drink 16 fluid ounces of diet soda daily, sparkling water.  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  Wt Readings from Last 3 Encounters:  07/03/19 136 lb 12.8 oz (62.1 kg)  05/31/19 136 lb (61.7 kg)  10/04/18 138 lb 9.6 oz (62.9 kg)  Her blood pressure has been controlled at home, today their BP is BP: 110/70 She does workout. She denies chest pain, shortness of breath, dizziness.   She is not on cholesterol medication and denies myalgias. Her cholesterol is at goal. The cholesterol last visit was:   Lab Results  Component Value Date   CHOL 176 06/27/2018   HDL 68 06/27/2018   LDLCALC 93 06/27/2018   TRIG 67 06/27/2018   CHOLHDL 2.6 06/27/2018   She has hx of prediabetes (5.7% in 05/2015) reversed by lifestyle modification; last A1C in the office was:  Lab Results  Component Value Date    HGBA1C 5.6 06/27/2018   Last GFR: Lab Results  Component Value Date   GFRNONAA 82 05/31/2019   Patient in on vitamin D supplement, taking 1000 IU daily:  Lab Results  Component Value Date   VD25OH 41 06/27/2018        Current Medications:  Current Outpatient Medications on File Prior to Visit  Medication Sig Dispense Refill  . CHOLECALCIFEROL PO Take 1,000 Units by mouth daily.    . hydrochlorothiazide (HYDRODIURIL) 25 MG tablet TAKE 1/2 TO 1 TABLET BY MOUTH DAILY FOR BLOOD PRESSURE AND FLUID 90 tablet 1  . losartan (COZAAR) 100 MG tablet Take 1/2-1 tab daily at night for blood pressure goal of <130/80. 90 tablet 1  . Multiple Vitamin (MULTIVITAMIN) tablet Take 1 tablet by mouth daily.     No current facility-administered medications on file prior to visit.    Allergies:  No Known Allergies Medical History:  She has Intramural leiomyoma of uterus; Hypertension; H/O dysplastic nevus; Bradycardia; BMI 25.0-25.9,adult; and Asymptomatic microscopic hematuria on their problem list. Health Maintenance:   Immunization History  Administered Date(s) Administered  . Influenza-Unspecified 05/17/2016, 05/17/2018, 05/11/2019  . Td 05/14/2006  . Tdap 06/08/2013   Tetanus: 2014 Pneumovax: N/A Prevnar 13: N/A Flu vaccine: 04/2019 Shingrix: Declines   Pap: 09/2017 with Dr. Sabra Heck, has scheduled this afternoon MGM: 04/2019 DEXA: N/A  Colonoscopy: 08/2013 due in 10 years EGD: N/A  Last Dental Exam: Dr. ?, last exam 2020, q50m Last Eye Exam: Dr. [place], last 04/2019, wears glasses Last skin exam: Annually, Dr. Carolynn Sayers NP, last visit 05/2018  Patient Care Team: Unk Pinto, MD as PCP - General (Internal Medicine) Pyrtle, Lajuan Lines, MD as Consulting Physician (Gastroenterology) Megan Salon, MD as Consulting Physician (Gynecology) Hillery Jacks, MD as Referring Physician (Dermatology)  Surgical History:  She has a past surgical history that includes Dilatation &  currettage/hysteroscopy with resectoscope (N/A, 04/10/2013); Laparoscopic cholecystectomy (09-09-2001  dr Dalbert Batman  @[facility] ); Colonoscopy with propofol (08/21/2013); and Laparoscopic bilateral salpingectomy (Right, 05/16/2018). Family History:  Herfamily history includes Arrhythmia in her paternal grandfather; Cerebral aneurysm (age of onset: 87) in her maternal grandmother; Diabetes in her maternal grandmother and mother; Hypertension in her brother, maternal grandfather, and mother; Obesity in her brother; Peripheral vascular disease in her brother; Stroke (age of onset: 54) in her paternal grandmother. Social History:  She reports that she has never smoked. She has never used smokeless tobacco. She reports that she does not drink alcohol or use drugs.  Review of Systems: Review of Systems  Constitutional: Negative for chills, diaphoresis, fever, malaise/fatigue and weight loss.  HENT: Negative for hearing loss and tinnitus.   Eyes: Negative for blurred vision and double vision.  Respiratory: Negative for cough, shortness of breath and wheezing.   Cardiovascular: Negative for chest pain, palpitations, orthopnea, claudication and leg swelling.  Gastrointestinal: Negative for  abdominal pain, blood in stool, constipation, diarrhea, heartburn, melena, nausea and vomiting.  Genitourinary: Negative.  Negative for dysuria and urgency.  Musculoskeletal: Negative for joint pain and myalgias.  Skin: Negative for rash.  Neurological: Negative for dizziness, tingling, sensory change, weakness and headaches.  Endo/Heme/Allergies: Negative for polydipsia.  Psychiatric/Behavioral: Negative.  Negative for depression, memory loss and substance abuse. The patient is not nervous/anxious and does not have insomnia.   All other systems reviewed and are negative.   Physical Exam: Estimated body mass index is 24.82 kg/m as calculated from the following:   Height as of this encounter: 5' 2.25" (1.581 m).   Weight as  of this encounter: 136 lb 12.8 oz (62.1 kg). BP 110/70   Pulse (!) 56   Temp (!) 96.8 F (36 C)   Ht 5' 2.25" (1.581 m)   Wt 136 lb 12.8 oz (62.1 kg)   LMP 06/16/2016   SpO2 98%   BMI 24.82 kg/m  General Appearance: Well nourished, in no apparent distress.  Eyes: PERRLA, EOMs, conjunctiva no swelling or erythema, fundal exam deferred to ophth  Sinuses: No Frontal/maxillary tenderness  ENT/Mouth: Ext aud canals clear, normal light reflex with TMs without erythema, bulging. Good dentition. No erythema, swelling, or exudate on post pharynx. Tonsils not swollen or erythematous. Hearing normal.  Neck: Supple, thyroid normal. No bruits  Respiratory: Respiratory effort normal, BS equal bilaterally without rales, rhonchi, wheezing or stridor.  Cardio: RRR without murmurs, rubs or gallops. Brisk peripheral pulses without edema.  Chest: symmetric, with normal excursions and percussion.  Breasts: Defer to GYN Abdomen: Soft, nontender, no guarding, rebound, hernias, masses, or organomegaly.  Lymphatics: Non tender without lymphadenopathy.  Genitourinary: Defer to GYN Musculoskeletal: Full ROM all peripheral extremities,5/5 strength, and normal gait.  Skin: Warm, dry without rashes, lesions, ecchymosis. Neuro: Cranial nerves intact, reflexes equal bilaterally. Normal muscle tone, no cerebellar symptoms. Sensation intact.  Psych: Awake and oriented X 3, normal affect, Insight and Judgment appropriate.   EKG: no ST changes, sinus bradycardia, IRBBB   Gorden Harms Layton Tappan 9:09 AM [facility]

## 2019-07-03 ENCOUNTER — Encounter: Payer: Self-pay | Admitting: Obstetrics & Gynecology

## 2019-07-03 ENCOUNTER — Other Ambulatory Visit: Payer: Self-pay

## 2019-07-03 ENCOUNTER — Ambulatory Visit (INDEPENDENT_AMBULATORY_CARE_PROVIDER_SITE_OTHER): Payer: No Typology Code available for payment source | Admitting: Obstetrics & Gynecology

## 2019-07-03 ENCOUNTER — Ambulatory Visit (INDEPENDENT_AMBULATORY_CARE_PROVIDER_SITE_OTHER): Payer: No Typology Code available for payment source | Admitting: Adult Health

## 2019-07-03 ENCOUNTER — Encounter: Payer: Self-pay | Admitting: Adult Health

## 2019-07-03 VITALS — BP 110/70 | HR 56 | Temp 96.8°F | Ht 62.25 in | Wt 136.8 lb

## 2019-07-03 VITALS — BP 112/76 | HR 64 | Temp 97.0°F | Resp 12 | Ht 62.0 in | Wt 137.2 lb

## 2019-07-03 DIAGNOSIS — R001 Bradycardia, unspecified: Secondary | ICD-10-CM

## 2019-07-03 DIAGNOSIS — Z87898 Personal history of other specified conditions: Secondary | ICD-10-CM

## 2019-07-03 DIAGNOSIS — Z01419 Encounter for gynecological examination (general) (routine) without abnormal findings: Secondary | ICD-10-CM

## 2019-07-03 DIAGNOSIS — E559 Vitamin D deficiency, unspecified: Secondary | ICD-10-CM

## 2019-07-03 DIAGNOSIS — I10 Essential (primary) hypertension: Secondary | ICD-10-CM | POA: Diagnosis not present

## 2019-07-03 DIAGNOSIS — R3121 Asymptomatic microscopic hematuria: Secondary | ICD-10-CM

## 2019-07-03 DIAGNOSIS — Z131 Encounter for screening for diabetes mellitus: Secondary | ICD-10-CM

## 2019-07-03 DIAGNOSIS — Z0000 Encounter for general adult medical examination without abnormal findings: Secondary | ICD-10-CM | POA: Diagnosis not present

## 2019-07-03 DIAGNOSIS — E2839 Other primary ovarian failure: Secondary | ICD-10-CM

## 2019-07-03 DIAGNOSIS — Z136 Encounter for screening for cardiovascular disorders: Secondary | ICD-10-CM

## 2019-07-03 DIAGNOSIS — Z6824 Body mass index (BMI) 24.0-24.9, adult: Secondary | ICD-10-CM

## 2019-07-03 DIAGNOSIS — Z0001 Encounter for general adult medical examination with abnormal findings: Secondary | ICD-10-CM

## 2019-07-03 DIAGNOSIS — D251 Intramural leiomyoma of uterus: Secondary | ICD-10-CM

## 2019-07-03 DIAGNOSIS — Z86018 Personal history of other benign neoplasm: Secondary | ICD-10-CM

## 2019-07-03 DIAGNOSIS — Z13220 Encounter for screening for lipoid disorders: Secondary | ICD-10-CM

## 2019-07-03 MED ORDER — LOSARTAN POTASSIUM-HCTZ 50-12.5 MG PO TABS: 1.0000 | ORAL_TABLET | Freq: Every day | ORAL | 1 refills | Status: DC

## 2019-07-03 NOTE — Progress Notes (Signed)
58 y.o. G0P0000 Married White or Caucasian female here for annual exam.  She did not get furloughed.  She is busy with work now.  Denies vaginal bleeding.     PCP:   Patient's last menstrual period was 06/16/2016.          Sexually active: Yes.    The current method of family planning is postmenopausal.    Exercising: Yes.    walking Smoker:  no  Health Maintenance: Pap:   10/11/17 Neg:Neg HR HPV  10/09/16 Neg              07/03/14 Neg  History of abnormal Pap:  no MMG:  04/28/19 BIRADS 1 negative/density c Colonoscopy:  08/21/13 Normal BMD:   Never  TDaP:  2014 Pneumonia vaccine(s):  n/a Shingrix:  Declines Hep C testing: 06/12/15 Neg Screening Labs: PCP   reports that she has never smoked. She has never used smokeless tobacco. She reports that she does not drink alcohol or use drugs.  Past Medical History:  Diagnosis Date  . History of anemia   . Ovarian neoplasm    right side  . Wears glasses     Past Surgical History:  Procedure Laterality Date  . COLONOSCOPY WITH PROPOFOL  08/21/2013  . DILATATION & CURRETTAGE/HYSTEROSCOPY WITH RESECTOCOPE N/A 04/10/2013   Procedure: DILATATION & CURETTAGE/HYSTEROSCOPY WITH RESECTOCOPE;  Surgeon: Lyman Speller, MD;  Location: [facility];  Service: Gynecology;  Laterality: N/A;  . LAPAROSCOPIC BILATERAL SALPINGECTOMY Right 05/16/2018   Procedure: WITH RIGHT OOPHORECTOMY;  Surgeon: Megan Salon, MD;  Location: [facility];  Service: Gynecology;  Laterality: Right;  possible RSO  . LAPAROSCOPIC CHOLECYSTECTOMY  09-09-2001  dr Dalbert Batman  @[facility]     Current Outpatient Medications  Medication Sig Dispense Refill  . CHOLECALCIFEROL PO Take 1,000 Units by mouth daily.    Marland Kitchen losartan-hydrochlorothiazide (HYZAAR) 50-12.5 MG tablet Take 1 tablet by mouth daily. For BP goal <130/80. 90 tablet 1  . Multiple Vitamin (MULTIVITAMIN) tablet Take 1 tablet by mouth daily.     No current facility-administered medications for this visit.      Family History  Problem Relation Age of Onset  . Diabetes Maternal Grandmother   . Cerebral aneurysm Maternal Grandmother 65  . Hypertension Maternal Grandfather   . Arrhythmia Paternal Grandfather   . Diabetes Mother   . Hypertension Mother   . Hypertension Brother   . Peripheral vascular disease Brother   . Obesity Brother   . Stroke Paternal Grandmother 29  . Colon cancer Neg Hx   . Pancreatic cancer Neg Hx   . Stomach cancer Neg Hx   . Breast cancer Neg Hx     Review of Systems  All other systems reviewed and are negative.   Exam:   BP 112/76 (BP Location: Left Arm, Patient Position: Sitting, Cuff Size: Normal)   Pulse 64   Temp (!) 97 F (36.1 C) (Temporal)   Resp 12   Ht 5\' 2"  (1.575 m)   Wt 137 lb 3.2 oz (62.2 kg)   LMP 06/16/2016   BMI 25.09 kg/m    Height: 5\' 2"  (157.5 cm)  Ht Readings from Last 3 Encounters:  07/03/19 5\' 2"  (1.575 m)  07/03/19 5' 2.25" (1.581 m)  05/31/19 5' 2.25" (1.581 m)    General appearance: alert, cooperative and appears stated age Head: Normocephalic, without obvious abnormality, atraumatic Neck: no adenopathy, supple, symmetrical, trachea midline and thyroid normal to inspection and palpation Lungs: clear to auscultation bilaterally  Breasts: normal appearance, no masses or tenderness Heart: regular rate and rhythm Abdomen: soft, non-tender; bowel sounds normal; no masses,  no organomegaly Extremities: extremities normal, atraumatic, no cyanosis or edema Skin: Skin color, texture, turgor normal. No rashes or lesions Lymph nodes: Cervical, supraclavicular, and axillary nodes normal. No abnormal inguinal nodes palpated Neurologic: Grossly normal   Pelvic: External genitalia:  no lesions              Urethra:  normal appearing urethra with no masses, tenderness or lesions              Bartholins and Skenes: normal                 Vagina: normal appearing vagina with normal color and discharge, no lesions               Cervix: no lesions              Pap taken: No. Bimanual Exam:  Uterus:  normal size, contour, position, consistency, mobility, non-tender              Adnexa: normal adnexa and no mass, fullness, tenderness               Rectovaginal: Confirms               Anus:  normal sphincter tone, no lesions  Chaperone was present for exam.  A:  Well Woman with normal exam S/p RSO and left salpingectomy (cystadenofibroma) Hypertension H/o breast cysts and benign calcifications  PMP, no HRT  P:   Mammogram guidelines reviewed Pap smear, negative with neg HR HPV 2019.  Not indicated today Colonoscopy is UTD Lab work done today with Dr. Melford Aase Plan BMD with MMG next year.  Order placed. Declines shingrix. Return annually or prn

## 2019-07-03 NOTE — Patient Instructions (Addendum)
Nancy Carter , Thank you for taking time to come for your Annual Wellness Visit. I appreciate your ongoing commitment to your health goals. Please review the following plan we discussed and let me know if I can assist you in the future.   These are the goals we discussed: Goals    . Blood Pressure < 130/80    . Weight (lb) < 138 lb (62.6 kg)       This is a list of the screening recommended for you and due dates:  Health Maintenance  Topic Date Due  . Pap Smear  10/11/2020  . Mammogram  04/27/2021  . Tetanus Vaccine  06/09/2023  . Colon Cancer Screening  08/22/2023  . Flu Shot  Completed  .  Hepatitis C: One time screening is recommended by Center for Disease Control  (CDC) for  adults born from [birth year] through 1963.   Completed  . HIV Screening  Completed      Know what a healthy weight is for you (roughly BMI <25) and aim to maintain this  Aim for 7+ servings of fruits and vegetables daily  65-80+ fluid ounces of water or unsweet tea for healthy kidneys  Limit to max 1 drink of alcohol per day; avoid smoking/tobacco  Limit animal fats in diet for cholesterol and heart health - choose grass fed whenever available  Avoid highly processed foods, and foods high in saturated/trans fats  Aim for low stress - take time to unwind and care for your mental health  Aim for 150 min of moderate intensity exercise weekly for heart health, and weights twice weekly for bone health  Aim for 7-9 hours of sleep daily        When it comes to diets, agreement about the perfect plan isn't easy to find, even among the experts. Experts at the [employer] developed an idea known as the Healthy Eating Plate. Just imagine a plate divided into logical, healthy portions.  The emphasis is on diet quality:  Load up on vegetables and fruits - one-half of your plate: Aim for color and variety, and remember that potatoes don't count.  Go for whole grains - one-quarter of your  plate: Whole wheat, barley, wheat berries, quinoa, oats, brown rice, and foods made with them. If you want pasta, go with whole wheat pasta.  Protein power - one-quarter of your plate: Fish, chicken, beans, and nuts are all healthy, versatile protein sources. Limit red meat.  The diet, however, does go beyond the plate, offering a few other suggestions.  Use healthy plant oils, such as olive, canola, soy, corn, sunflower and peanut. Check the labels, and avoid partially hydrogenated oil, which have unhealthy trans fats.  If you're thirsty, drink water. Coffee and tea are good in moderation, but skip sugary drinks and limit milk and dairy products to one or two daily servings.  The type of carbohydrate in the diet is more important than the amount. Some sources of carbohydrates, such as vegetables, fruits, whole grains, and beans-are healthier than others.  Finally, stay active.      Bradycardia, Adult Bradycardia is a slower-than-normal heartbeat. A normal resting heart rate for an adult ranges from 60 to 100 beats per minute. With bradycardia, the resting heart rate is less than 60 beats per minute. Bradycardia can prevent enough oxygen from reaching certain areas of your body when you are active. It can be serious if it keeps enough oxygen from reaching your brain and  other parts of your body. Bradycardia is not a problem for everyone. For some healthy adults, a slow resting heart rate is normal. What are the causes? This condition may be caused by:  A problem with the heart, including: ? A problem with the heart's electrical system, such as a heart block. With a heart block, electrical signals between the chambers of the heart are partially or completely blocked, so they are not able to work as they should. ? A problem with the heart's natural pacemaker (sinus node). ? Heart disease. ? A heart attack. ? Heart damage. ? Lyme disease. ? A heart infection. ? A heart condition that is  present at birth (congenital heart defect).  Certain medicines that treat heart conditions.  Certain conditions, such as hypothyroidism and obstructive sleep apnea.  Problems with the balance of chemicals and other substances, like potassium, in the blood.  Trauma.  Radiation therapy. What increases the risk? You are more likely to develop this condition if you:  Are age 47 or older.  Have high blood pressure (hypertension), high cholesterol (hyperlipidemia), or diabetes.  Drink heavily, use tobacco or nicotine products, or use drugs. What are the signs or symptoms? Symptoms of this condition include:  Light-headedness.  Feeling faint or fainting.  Fatigue and weakness.  Trouble with activity or exercise.  Shortness of breath.  Chest pain (angina).  Drowsiness.  Confusion.  Dizziness. How is this diagnosed? This condition may be diagnosed based on:  Your symptoms.  Your medical history.  A physical exam. During the exam, your health care provider will listen to your heartbeat and check your pulse. To confirm the diagnosis, your health care provider may order tests, such as:  Blood tests.  An electrocardiogram (ECG). This test records the heart's electrical activity. The test can show how fast your heart is beating and whether the heartbeat is steady.  A test in which you wear a portable device (event recorder or Holter monitor) to record your heart's electrical activity while you go about your day.  Anexercise test. How is this treated? Treatment for this condition depends on the cause of the condition and how severe your symptoms are. Treatment may involve:  Treatment of the underlying condition.  Changing your medicines or how much medicine you take.  Having a small, battery-operated device called a pacemaker implanted under the skin. When bradycardia occurs, this device can be used to increase your heart rate and help your heart beat in a regular  rhythm. Follow these instructions at home: Lifestyle   Manage any health conditions that contribute to bradycardia as told by your health care provider.  Follow a heart-healthy diet. A nutrition specialist (dietitian) can help educate you about healthy food options and changes.  Follow an exercise program that is approved by your health care provider.  Maintain a healthy weight.  Try to reduce or manage your stress, such as with yoga or meditation. If you need help reducing stress, ask your health care provider.  Do not use any products that contain nicotine or tobacco, such as cigarettes, e-cigarettes, and chewing tobacco. If you need help quitting, ask your health care provider.  Do not use illegal drugs.  Limit alcohol intake to no more than 1 drink a day for nonpregnant women and 2 drinks a day for men. Be aware of how much alcohol is in your drink. In the [place] one drink equals one 12 oz bottle of beer (355 mL), one 5 oz glass of wine (148  mL), or one 1 oz glass of hard liquor (44 mL). General instructions  Take over-the-counter and prescription medicines only as told by your health care provider.  Keep all follow-up visits as told by your health care provider. This is important. How is this prevented? In some cases, bradycardia may be prevented by:  Treating underlying medical problems.  Stopping behaviors or medicines that can trigger the condition. Contact a health care provider if you:  Feel light-headed or dizzy.  Almost faint.  Feel weak or are easily fatigued during physical activity.  Experience confusion or have memory problems. Get help right away if:  You faint.  You have: ? An irregular heartbeat (palpitations). ? Chest pain. ? Trouble breathing. Summary  Bradycardia is a slower-than-normal heartbeat. With bradycardia, the resting heart rate is less than 60 beats per minute.  Treatment for this condition depends on the cause.  Manage any health  conditions that contribute to bradycardia as told by your health care provider.  Do not use any products that contain nicotine or tobacco, such as cigarettes, e-cigarettes, and chewing tobacco, and limit alcohol intake.  Keep all follow-up visits as told by your health care provider. This is important. This information is not intended to replace advice given to you by your health care provider. Make sure you discuss any questions you have with your health care provider. Document Released: 04/25/2002 Document Revised: 02/14/2018 Document Reviewed: 01/12/2018 Elsevier Patient Education  2020 [employer].

## 2019-07-04 ENCOUNTER — Encounter: Payer: Self-pay | Admitting: Adult Health

## 2019-07-04 DIAGNOSIS — E785 Hyperlipidemia, unspecified: Secondary | ICD-10-CM | POA: Insufficient documentation

## 2019-07-04 LAB — LIPID PANEL
Cholesterol: 186 mg/dL (ref <200)
HDL: 61 mg/dL (ref >=50)
LDL Cholesterol (Calc): 104 mg/dL (calc) — ABNORMAL HIGH
Non-HDL Cholesterol (Calc): 125 mg/dL (calc) (ref <130)
Total CHOL/HDL Ratio: 3 (calc) (ref <5.0)
Triglycerides: 110 mg/dL (ref <150)

## 2019-07-04 LAB — MICROALBUMIN / CREATININE URINE RATIO
Creatinine, Urine: 104 mg/dL (ref 20–275)
Microalb Creat Ratio: 5 mcg/mg creat (ref <30)
Microalb, Ur: 0.5 mg/dL

## 2019-07-04 LAB — VITAMIN D 25 HYDROXY (VIT D DEFICIENCY, FRACTURES): Vit D, 25-Hydroxy: 56 ng/mL (ref 30–100)

## 2019-07-04 LAB — URINALYSIS, ROUTINE W REFLEX MICROSCOPIC
Bacteria, UA: NONE SEEN /HPF
Bilirubin Urine: NEGATIVE
Glucose, UA: NEGATIVE
Hgb urine dipstick: NEGATIVE
Hyaline Cast: NONE SEEN /LPF
Ketones, ur: NEGATIVE
Nitrite: NEGATIVE
Protein, ur: NEGATIVE
RBC / HPF: NONE SEEN /HPF (ref 0–2)
Specific Gravity, Urine: 1.015 (ref 1.001–1.03)
Squamous Epithelial / HPF: NONE SEEN /HPF (ref <=5)
WBC, UA: NONE SEEN /HPF (ref 0–5)
pH: 7 (ref 5.0–8.0)

## 2019-07-04 LAB — COMPLETE METABOLIC PANEL WITH GFR
AG Ratio: 1.5 (calc) (ref 1.0–2.5)
ALT: 12 U/L (ref 6–29)
AST: 17 U/L (ref 10–35)
Albumin: 4.4 g/dL (ref 3.6–5.1)
Alkaline phosphatase (APISO): 71 U/L (ref 37–153)
BUN: 14 mg/dL (ref 7–25)
CO2: 28 mmol/L (ref 20–32)
Calcium: 10.1 mg/dL (ref 8.6–10.4)
Chloride: 100 mmol/L (ref 98–110)
Creat: 0.81 mg/dL (ref 0.50–1.05)
GFR, Est African American: 94 mL/min/{1.73_m2} (ref >=60)
GFR, Est Non African American: 81 mL/min/{1.73_m2} (ref >=60)
Globulin: 2.9 g/dL (calc) (ref 1.9–3.7)
Glucose, Bld: 89 mg/dL (ref 65–99)
Potassium: 4.2 mmol/L (ref 3.5–5.3)
Sodium: 137 mmol/L (ref 135–146)
Total Bilirubin: 0.5 mg/dL (ref 0.2–1.2)
Total Protein: 7.3 g/dL (ref 6.1–8.1)

## 2019-07-04 LAB — CBC WITH DIFFERENTIAL/PLATELET
Absolute Monocytes: 260 cells/uL (ref 200–950)
Basophils Absolute: 80 cells/uL (ref 0–200)
Basophils Relative: 1.5 %
Eosinophils Absolute: 48 cells/uL (ref 15–500)
Eosinophils Relative: 0.9 %
HCT: 40.2 % (ref 35.0–45.0)
Hemoglobin: 13.5 g/dL (ref 11.7–15.5)
Lymphs Abs: 1738 cells/uL (ref 850–3900)
MCH: 30.9 pg (ref 27.0–33.0)
MCHC: 33.6 g/dL (ref 32.0–36.0)
MCV: 92 fL (ref 80.0–100.0)
MPV: 11.5 fL (ref 7.5–12.5)
Monocytes Relative: 4.9 %
Neutro Abs: 3175 cells/uL (ref 1500–7800)
Neutrophils Relative %: 59.9 %
Platelets: 237 10*3/uL (ref 140–400)
RBC: 4.37 10*6/uL (ref 3.80–5.10)
RDW: 11.7 % (ref 11.0–15.0)
Total Lymphocyte: 32.8 %
WBC: 5.3 10*3/uL (ref 3.8–10.8)

## 2019-07-04 LAB — HEMOGLOBIN A1C
Hgb A1c MFr Bld: 5.5 % of total Hgb (ref <5.7)
Mean Plasma Glucose: 111 (calc)
eAG (mmol/L): 6.2 (calc)

## 2019-07-04 LAB — TSH: TSH: 1.07 mIU/L (ref 0.40–4.50)

## 2019-07-04 LAB — MAGNESIUM: Magnesium: 2 mg/dL (ref 1.5–2.5)

## 2019-08-14 MED FILL — PREVIDENT 5000 BOOSTER PLUS: 1.1 | 30 days supply | Qty: 100 | Fill #1

## 2019-12-05 ENCOUNTER — Other Ambulatory Visit: Payer: Self-pay

## 2019-12-05 MED ORDER — LOSARTAN POTASSIUM-HCTZ 50-12.5 MG PO TABS: 1.0000 | ORAL_TABLET | Freq: Every day | ORAL | 1 refills | Status: DC

## 2019-12-05 MED FILL — LOSARTAN-HCTZ 50-12.5 MG TA: 50-12.5 | 90 days supply | Qty: 90 | Fill #0

## 2020-01-01 DIAGNOSIS — E559 Vitamin D deficiency, unspecified: Secondary | ICD-10-CM | POA: Insufficient documentation

## 2020-01-01 NOTE — Progress Notes (Signed)
FOLLOW UP  Assessment and Plan:   Hypertension Well controlled with current medications; continue hyzaar 50/12.5 mg daily  Monitor blood pressure at home; patient to call if consistently greater than 130/80 Continue DASH diet.   Reminder to go to the ER if any CP, SOB, nausea, dizziness, severe HA, changes vision/speech, left arm numbness and tingling and jaw pain.  Bradycardia Denies any dizziness, fatigue, exercise intolerance Monitor, avoid rate controlling drugs  Cholesterol Mild elevations currently managed by lifestyle Continue low cholesterol diet and exercise.  Check lipid panel.    Overweight, BMI 26 Commended on weight loss progress, very nearly at goal  Long discussion about weight loss, diet, and exercise Recommended diet heavy in fruits and veggies and low in animal meats, cheeses, and dairy products, appropriate calorie intake Discussed appropriate weight for height (138lb) Follow up at next visit  Vitamin D Def Near goal at last visit;  Taking 1000 IU daily continue supplementation to maintain goal of 60-100 Defer Vit D level to CPE   Continue diet and meds as discussed. Further disposition pending results of labs. Discussed med's effects and SE's.   Over 30 minutes of exam, counseling, chart review, and critical decision making was performed.   Future Appointments  Date Time Provider [place]  07/02/2020  9:00 AM Liane Comber, NP [facility] None  09/27/2020  1:30 PM Megan Salon, MD [place] None    ----------------------------------------------------------------------------------------------------------------------  HPI BP 122/82   Pulse (!) 51   Temp (!) 97.3 F (36.3 C)   Ht 5' 2.25" (1.581 m)   Wt 147 lb (66.7 kg)   LMP 06/16/2016   SpO2 98%   BMI 26.67 kg/m   59 y.o. female  presents for 6 month follow up on hypertension, weight and vitamin D deficiency. She has been followed  by Dr. 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 She plans to start joining her husband at [employer] and restart classes as they become available.  She reports watching her diet, but husband is cooking more and feels lack of exercise is main culprit of weight gain Denies alcohol intake, admits to too much potato chips Wt Readings from Last 3 Encounters:  01/02/20 147 lb (66.7 kg)  07/03/19 137 lb 3.2 oz (62.2 kg)  07/03/19 136 lb 12.8 oz (62.1 kg)   She is newly on hyzaar, taking 50/12.5 mg since last visit.  Her blood pressure has been controlled at home (120s/80s), today their BP is BP: 122/82  She does workout. She denies chest pain, shortness of breath, dizziness.   She is not on cholesterol medication and denies myalgias. Her cholesterol is not at goal. The cholesterol last visit was:   Lab Results  Component Value Date   CHOL 186 07/03/2019   HDL 61 07/03/2019   LDLCALC 104 (H) 07/03/2019   TRIG 110 07/03/2019   CHOLHDL 3.0 07/03/2019   Patient is newly on Vitamin D supplement, taking 1000 IU daily   Lab Results  Component Value Date   VD25OH 56 07/03/2019        Current Medications:  Current Outpatient Medications on File Prior to Visit  Medication Sig  . CHOLECALCIFEROL PO Take 1,000 Units by mouth  daily.  . losartan-hydrochlorothiazide (HYZAAR) 50-12.5 MG tablet Take 1 tablet by mouth daily. For BP goal <130/80.  . Multiple Vitamin (MULTIVITAMIN) tablet Take 1 tablet by mouth daily.   No current facility-administered medications on file prior to visit.     Allergies: No Known Allergies   Medical History:  Past Medical History:  Diagnosis Date  . History of anemia   . Ovarian neoplasm    right side  . Wears glasses     Family history- Reviewed and unchanged Social history- Reviewed and unchanged   Review of Systems:  Review of Systems  Constitutional: Negative for malaise/fatigue and weight loss.  HENT: Negative for hearing loss and tinnitus.   Eyes: Negative for blurred vision and double vision.  Respiratory: Negative for cough, shortness of breath and wheezing.   Cardiovascular: Negative for chest pain, palpitations, orthopnea, claudication and leg swelling.  Gastrointestinal: Negative for abdominal pain, blood in stool, constipation, diarrhea, heartburn, melena, nausea and vomiting.  Genitourinary: Negative.   Musculoskeletal: Negative for joint pain and myalgias.  Skin: Negative for rash.  Neurological: Negative for dizziness, tingling, sensory change, weakness and headaches.  Endo/Heme/Allergies: Negative for polydipsia.  Psychiatric/Behavioral: Negative.   All other systems reviewed and are negative.    Physical Exam: BP 122/82   Pulse (!) 51   Temp (!) 97.3 F (36.3 C)   Ht 5' 2.25" (1.581 m)   Wt 147 lb (66.7 kg)   LMP 06/16/2016   SpO2 98%   BMI 26.67 kg/m  Wt Readings from Last 3 Encounters:  01/02/20 147 lb (66.7 kg)  07/03/19 137 lb 3.2 oz (62.2 kg)  07/03/19 136 lb 12.8 oz (62.1 kg)   General Appearance: Well nourished, in no apparent distress. Eyes: PERRLA, EOMs, conjunctiva no swelling or erythema Sinuses: No Frontal/maxillary tenderness ENT/Mouth: Ext aud canals clear, TMs without erythema, bulging. No erythema, swelling, or exudate on post pharynx.  Tonsils not swollen or erythematous. Hearing normal.  Neck: Supple, thyroid normal.  Respiratory: Respiratory effort normal, BS equal bilaterally without rales, rhonchi, wheezing or stridor.  Cardio: RRR with no MRGs. Brisk peripheral pulses without edema.  Abdomen: Soft, + BS.  Non tender, no guarding, rebound, hernias, masses. Lymphatics: Non tender without lymphadenopathy.  Musculoskeletal: Full ROM, 5/5 strength,  Normal gait Skin: Warm, dry without rashes, lesions, ecchymosis.  Neuro: Cranial nerves intact. No cerebellar symptoms.  Psych: Awake and oriented X 3, normal affect, Insight and Judgment appropriate.    Izora Ribas, NP 9:44 AM [facility]

## 2020-01-02 ENCOUNTER — Encounter: Payer: Self-pay | Admitting: Adult Health

## 2020-01-02 ENCOUNTER — Ambulatory Visit (INDEPENDENT_AMBULATORY_CARE_PROVIDER_SITE_OTHER): Payer: No Typology Code available for payment source | Admitting: Adult Health

## 2020-01-02 ENCOUNTER — Other Ambulatory Visit: Payer: Self-pay

## 2020-01-02 VITALS — BP 122/82 | HR 51 | Temp 97.3°F | Ht 62.25 in | Wt 147.0 lb

## 2020-01-02 DIAGNOSIS — Z6825 Body mass index (BMI) 25.0-25.9, adult: Secondary | ICD-10-CM | POA: Diagnosis not present

## 2020-01-02 DIAGNOSIS — E559 Vitamin D deficiency, unspecified: Secondary | ICD-10-CM | POA: Diagnosis not present

## 2020-01-02 DIAGNOSIS — I10 Essential (primary) hypertension: Secondary | ICD-10-CM | POA: Diagnosis not present

## 2020-01-02 DIAGNOSIS — E785 Hyperlipidemia, unspecified: Secondary | ICD-10-CM

## 2020-01-02 NOTE — Patient Instructions (Addendum)
Goals    . Blood Pressure < 130/80    . Exercise 150 min/wk Moderate Activity     Aim for 15-20 min walking for every 4 hours of sitting    . LDL CALC < 100    . Weight (lb) < 138 lb (62.6 kg)       A great goal to work towards is aiming to get in a serving daily of some of the most nutritionally dense foods - G- BOMBS daily        High-Fiber Diet Fiber, also called dietary fiber, is a type of carbohydrate that is found in fruits, vegetables, whole grains, and beans. A high-fiber diet can have many health benefits. Your health care provider may recommend a high-fiber diet to help:  Prevent constipation. Fiber can make your bowel movements more regular.  Lower your cholesterol.  Relieve the following conditions: ? Swelling of veins in the anus (hemorrhoids). ? Swelling and irritation (inflammation) of specific areas of the digestive tract (uncomplicated diverticulosis). ? A problem of the large intestine (colon) that sometimes causes pain and diarrhea (irritable bowel syndrome, IBS).  Prevent overeating as part of a weight-loss plan.  Prevent heart disease, type 2 diabetes, and certain cancers. What is my plan? The recommended daily fiber intake in grams (g) includes:  38 g for men age 20 or younger.  30 g for men over age 105.  10 g for women age 60 or younger.  21 g for women over age 47. You can get the recommended daily intake of dietary fiber by:  Eating a variety of fruits, vegetables, grains, and beans.  Taking a fiber supplement, if it is not possible to get enough fiber through your diet. What do I need to know about a high-fiber diet?  It is better to get fiber through food sources rather than from fiber supplements. There is not a lot of research about how effective supplements are.  Always check the fiber content on the nutrition facts label of any prepackaged food. Look for foods that contain 5 g of fiber or more per serving.  Talk with a diet and  nutrition specialist (dietitian) if you have questions about specific foods that are recommended or not recommended for your medical condition, especially if those foods are not listed below.  Gradually increase how much fiber you consume. If you increase your intake of dietary fiber too quickly, you may have bloating, cramping, or gas.  Drink plenty of water. Water helps you to digest fiber. What are tips for following this plan?  Eat a wide variety of high-fiber foods.  Make sure that half of the grains that you eat each day are whole grains.  Eat breads and cereals that are made with whole-grain flour instead of refined flour or white flour.  Eat brown rice, bulgur wheat, or millet instead of white rice.  Start the day with a breakfast that is high in fiber, such as a cereal that contains 5 g of fiber or more per serving.  Use beans in place of meat in soups, salads, and pasta dishes.  Eat high-fiber snacks, such as berries, raw vegetables, nuts, and popcorn.  Choose whole fruits and vegetables instead of processed forms like juice or sauce. What foods can I eat?  Fruits Berries. Pears. Apples. Oranges. Avocado. Prunes and raisins. Dried figs. Vegetables Sweet potatoes. Spinach. Kale. Artichokes. Cabbage. Broccoli. Cauliflower. Green peas. Carrots. Squash. Grains Whole-grain breads. Multigrain cereal. Oats and oatmeal. Brown rice. Barley. Bulgur  wheat. Millet. Quinoa. Bran muffins. Popcorn. Rye wafer crackers. Meats and other proteins Navy, kidney, and pinto beans. Soybeans. Split peas. Lentils. Nuts and seeds. Dairy Fiber-fortified yogurt. Beverages Fiber-fortified soy milk. Fiber-fortified orange juice. Other foods Fiber bars. The items listed above may not be a complete list of recommended foods and beverages. Contact a dietitian for more options. What foods are not recommended? Fruits Fruit juice. Cooked, strained fruit. Vegetables Fried potatoes. Canned vegetables.  Well-cooked vegetables. Grains White bread. Pasta made with refined flour. White rice. Meats and other proteins Fatty cuts of meat. Fried chicken or fried fish. Dairy Milk. Yogurt. Cream cheese. Sour cream. Fats and oils Butters. Beverages Soft drinks. Other foods Cakes and pastries. The items listed above may not be a complete list of foods and beverages to avoid. Contact a dietitian for more information. Summary  Fiber is a type of carbohydrate. It is found in fruits, vegetables, whole grains, and beans.  There are many health benefits of eating a high-fiber diet, such as preventing constipation, lowering blood cholesterol, helping with weight loss, and reducing your risk of heart disease, diabetes, and certain cancers.  Gradually increase your intake of fiber. Increasing too fast can result in cramping, bloating, and gas. Drink plenty of water while you increase your fiber.  The best sources of fiber include whole fruits and vegetables, whole grains, nuts, seeds, and beans. This information is not intended to replace advice given to you by your health care provider. Make sure you discuss any questions you have with your health care provider. Document Revised: 06/07/2017 Document Reviewed: 06/07/2017 Elsevier Patient Education  2020 [employer].

## 2020-01-03 LAB — COMPLETE METABOLIC PANEL WITH GFR
AG Ratio: 1.5 (calc) (ref 1.0–2.5)
ALT: 10 U/L (ref 6–29)
AST: 15 U/L (ref 10–35)
Albumin: 4.3 g/dL (ref 3.6–5.1)
Alkaline phosphatase (APISO): 74 U/L (ref 37–153)
BUN: 10 mg/dL (ref 7–25)
CO2: 31 mmol/L (ref 20–32)
Calcium: 9.9 mg/dL (ref 8.6–10.4)
Chloride: 103 mmol/L (ref 98–110)
Creat: 0.82 mg/dL (ref 0.50–1.05)
GFR, Est African American: 93 mL/min/{1.73_m2} (ref >=60)
GFR, Est Non African American: 80 mL/min/{1.73_m2} (ref >=60)
Globulin: 2.8 g/dL (calc) (ref 1.9–3.7)
Glucose, Bld: 85 mg/dL (ref 65–99)
Potassium: 4.4 mmol/L (ref 3.5–5.3)
Sodium: 141 mmol/L (ref 135–146)
Total Bilirubin: 0.4 mg/dL (ref 0.2–1.2)
Total Protein: 7.1 g/dL (ref 6.1–8.1)

## 2020-01-03 LAB — LIPID PANEL
Cholesterol: 170 mg/dL (ref <200)
HDL: 62 mg/dL (ref >=50)
LDL Cholesterol (Calc): 93 mg/dL (calc)
Non-HDL Cholesterol (Calc): 108 mg/dL (calc) (ref <130)
Total CHOL/HDL Ratio: 2.7 (calc) (ref <5.0)
Triglycerides: 60 mg/dL (ref <150)

## 2020-01-03 LAB — CBC WITH DIFFERENTIAL/PLATELET
Absolute Monocytes: 372 cells/uL (ref 200–950)
Basophils Absolute: 72 cells/uL (ref 0–200)
Basophils Relative: 1.2 %
Eosinophils Absolute: 72 cells/uL (ref 15–500)
Eosinophils Relative: 1.2 %
HCT: 38.8 % (ref 35.0–45.0)
Hemoglobin: 12.8 g/dL (ref 11.7–15.5)
Lymphs Abs: 1782 cells/uL (ref 850–3900)
MCH: 30 pg (ref 27.0–33.0)
MCHC: 33 g/dL (ref 32.0–36.0)
MCV: 90.9 fL (ref 80.0–100.0)
MPV: 11.5 fL (ref 7.5–12.5)
Monocytes Relative: 6.2 %
Neutro Abs: 3702 cells/uL (ref 1500–7800)
Neutrophils Relative %: 61.7 %
Platelets: 215 10*3/uL (ref 140–400)
RBC: 4.27 10*6/uL (ref 3.80–5.10)
RDW: 11.4 % (ref 11.0–15.0)
Total Lymphocyte: 29.7 %
WBC: 6 10*3/uL (ref 3.8–10.8)

## 2020-01-03 LAB — MAGNESIUM: Magnesium: 2 mg/dL (ref 1.5–2.5)

## 2020-01-03 LAB — TSH: TSH: 1.11 mIU/L (ref 0.40–4.50)

## 2020-01-17 ENCOUNTER — Other Ambulatory Visit: Payer: Self-pay | Admitting: Obstetrics & Gynecology

## 2020-01-17 DIAGNOSIS — Z1231 Encounter for screening mammogram for malignant neoplasm of breast: Secondary | ICD-10-CM

## 2020-02-05 ENCOUNTER — Other Ambulatory Visit: Payer: Self-pay | Admitting: Obstetrics & Gynecology

## 2020-02-05 DIAGNOSIS — E2839 Other primary ovarian failure: Secondary | ICD-10-CM

## 2020-03-12 MED FILL — LOSARTAN-HCTZ 50-12.5 MG TA: 50-12.5 | 90 days supply | Qty: 90 | Fill #1

## 2020-04-29 ENCOUNTER — Ambulatory Visit
Admission: RE | Admit: 2020-04-29 | Discharge: 2020-04-29 | Disposition: A | Discharge status: Home / Self Care | Payer: No Typology Code available for payment source | Source: Ambulatory Visit | Attending: Obstetrics & Gynecology | Admitting: Obstetrics & Gynecology

## 2020-04-29 ENCOUNTER — Other Ambulatory Visit: Payer: Self-pay

## 2020-04-29 DIAGNOSIS — E2839 Other primary ovarian failure: Secondary | ICD-10-CM

## 2020-04-29 DIAGNOSIS — Z1231 Encounter for screening mammogram for malignant neoplasm of breast: Secondary | ICD-10-CM

## 2020-07-01 ENCOUNTER — Other Ambulatory Visit: Payer: Self-pay | Admitting: Adult Health

## 2020-07-01 ENCOUNTER — Other Ambulatory Visit: Payer: Self-pay | Admitting: Internal Medicine

## 2020-07-02 ENCOUNTER — Other Ambulatory Visit: Payer: Self-pay

## 2020-07-02 ENCOUNTER — Other Ambulatory Visit: Payer: Self-pay | Admitting: Adult Health Nurse Practitioner

## 2020-07-02 ENCOUNTER — Encounter: Payer: Self-pay | Admitting: Adult Health Nurse Practitioner

## 2020-07-02 ENCOUNTER — Ambulatory Visit (INDEPENDENT_AMBULATORY_CARE_PROVIDER_SITE_OTHER): Payer: No Typology Code available for payment source | Admitting: Adult Health Nurse Practitioner

## 2020-07-02 VITALS — BP 110/80 | HR 76 | Temp 97.6°F | Ht 62.0 in | Wt 145.6 lb

## 2020-07-02 DIAGNOSIS — E559 Vitamin D deficiency, unspecified: Secondary | ICD-10-CM

## 2020-07-02 DIAGNOSIS — Z6825 Body mass index (BMI) 25.0-25.9, adult: Secondary | ICD-10-CM

## 2020-07-02 DIAGNOSIS — Z1389 Encounter for screening for other disorder: Secondary | ICD-10-CM

## 2020-07-02 DIAGNOSIS — Z136 Encounter for screening for cardiovascular disorders: Secondary | ICD-10-CM | POA: Diagnosis not present

## 2020-07-02 DIAGNOSIS — D251 Intramural leiomyoma of uterus: Secondary | ICD-10-CM

## 2020-07-02 DIAGNOSIS — Z0001 Encounter for general adult medical examination with abnormal findings: Secondary | ICD-10-CM

## 2020-07-02 DIAGNOSIS — Z130 Encounter for screening for diseases of the blood and blood-forming organs and certain disorders involving the immune mechanism: Secondary | ICD-10-CM

## 2020-07-02 DIAGNOSIS — R311 Benign essential microscopic hematuria: Secondary | ICD-10-CM

## 2020-07-02 DIAGNOSIS — D4959 Neoplasm of unspecified behavior of other genitourinary organ: Secondary | ICD-10-CM | POA: Insufficient documentation

## 2020-07-02 DIAGNOSIS — Z1321 Encounter for screening for nutritional disorder: Secondary | ICD-10-CM

## 2020-07-02 DIAGNOSIS — I10 Essential (primary) hypertension: Secondary | ICD-10-CM

## 2020-07-02 DIAGNOSIS — Z0000 Encounter for general adult medical examination without abnormal findings: Secondary | ICD-10-CM | POA: Diagnosis not present

## 2020-07-02 DIAGNOSIS — E785 Hyperlipidemia, unspecified: Secondary | ICD-10-CM

## 2020-07-02 DIAGNOSIS — Z131 Encounter for screening for diabetes mellitus: Secondary | ICD-10-CM

## 2020-07-02 MED ORDER — LOSARTAN POTASSIUM-HCTZ 50-12.5 MG PO TABS: ORAL_TABLET | ORAL | 1 refills | Status: DC

## 2020-07-02 MED FILL — LOSARTAN-HCTZ 50-12.5 MG TA: 50-12.5 | 90 days supply | Qty: 90 | Fill #0

## 2020-07-02 NOTE — Progress Notes (Signed)
COMPLETE PHYSICAL  Assessment and Plan:  Kamariyah was seen today for annual exam   Diagnoses and all orders for this visit:  Encounter for routine adult health examination with abnormal findings Yearly  Essential hypertension Continue medications; Losartan / HCTZ 50-12.5mg  at goal; patient is RN and monitors closely  Monitor blood pressure at home; call if consistently over 130/80 Discussed DASH diet Advised to go to the ER if any CP, SOB, nausea, dizziness, severe HA, changes vision/speech, left arm numbness and tingling and jaw pain. Follow up in 6 months  Intramural leiomyoma of uterus      -       Continue f/u with GYN as recommended  BMI 24 Continue to recommend diet heavy in fruits and veggies and low in animal meats, cheeses, and dairy products, appropriate calorie intake Discuss exercise recommendations routinely Continue to monitor weight at each visit  Benign microscopic hematuria Has seen urology without further workup recommended Low risk hx Continue to monitor  UA, microalbumin  Screening cholesterol level -     Lipid panel  Screening for diabetes mellitus/ hx of prediabetes Discussed diet/exercise, weight management  -     Hemoglobin A1c  Screening for thyroid disorder -     TSH  Vitamin D deficiency -     VITAMIN D 25 Hydroxy (Vit-D Deficiency, Fractures)  Medication management Continued  Discussed med's effects and SE's. Screening labs and tests as requested with regular follow-up as recommended. Over 40 minutes face to face interview,  exam, counseling, chart review, and complex, high level critical decision making was performed this visit.   Future Appointments  Date Time Provider [place]  09/27/2020  1:30 PM [place] None  07/01/2021 10:00 AM Shayn Madole, [place], NP [facility] None    HPI  59 y.o. Caucasian female, presents for a complete physical and follow up for has Intramural leiomyoma of uterus; Hypertension; H/O  dysplastic nevus; Bradycardia; BMI 25.0-25.9,adult; Asymptomatic microscopic hematuria; Hyperlipidemia; and Vitamin D deficiency on their problem list.   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   She has no concerns today.   She has been followed  by Dr. 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   She sees dermatology Dr. Carolynn Sayers, NP annually for history of dysplastic nevus.   She has had ongoing asymptomatic microscopic hematuria for which she saw urology and was not recommended workup.   BMI is Body mass index is 26.63 kg/m., she has been working on diet and exercise, walking twice weekly, drinks ~ 1 gallon unsweet tea daily, she does drink 16 fluid ounces of diet soda daily, sparkling water.  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  Wt Readings from Last 3 Encounters:  07/02/20 145 lb 9.6 oz (66 kg)  01/02/20 147 lb (66.7 kg)  07/03/19 137 lb 3.2 oz (62.2 kg)   Her blood pressure has been controlled at home, today their BP is BP: 110/80 She does workout. She denies chest pain, shortness of breath, dizziness.   She is not on cholesterol medication and denies myalgias. Her cholesterol is at goal. The cholesterol last visit was:   Lab Results  Component Value Date   CHOL 170 01/02/2020   HDL 62 01/02/2020   LDLCALC 93 01/02/2020   TRIG 60 01/02/2020   CHOLHDL 2.7 01/02/2020  She has hx of prediabetes (5.7% in 05/2015) reversed by lifestyle modification; last A1C in the office was:  Lab Results  Component Value Date   HGBA1C 5.5 07/03/2019   Last GFR: Lab Results  Component Value Date   GFRNONAA 80 01/02/2020   Patient in on vitamin D supplement, taking 1000 IU daily:  Lab Results  Component Value Date   VD25OH 56 07/03/2019        Current Medications:  Current Outpatient Medications on File Prior  to Visit  Medication Sig Dispense Refill  . CHOLECALCIFEROL PO Take 1,000 Units by mouth daily.    Marland Kitchen losartan-hydrochlorothiazide (HYZAAR) 50-12.5 MG tablet Take     1 tablet     Daily      for BP 90 tablet 0  . Multiple Vitamin (MULTIVITAMIN) tablet Take 1 tablet by mouth daily.     No current facility-administered medications on file prior to visit.   Allergies:  No Known Allergies Medical History:  She has Intramural leiomyoma of uterus; Hypertension; H/O dysplastic nevus; Bradycardia; BMI 25.0-25.9,adult; Asymptomatic microscopic hematuria; Hyperlipidemia; and Vitamin D deficiency on their problem list. Health Maintenance:   Immunization History  Administered Date(s) Administered  . Influenza-Unspecified 05/17/2016, 04/22/2018, 05/17/2018, 05/11/2019  . Td 05/14/2006  . Tdap 06/08/2013   Tetanus: 2014 Pneumovax: N/A Prevnar 13: N/A Flu vaccine: 04/2019 Shingrix: Declines   Pap: 09/2017 with Dr. Sabra Heck, has scheduled this afternoon MGM: 04/2019 DEXA: N/A SARS COV2-Pfizer: Complete 12/2019  Colonoscopy: 08/2013 due in 10 years EGD: N/A  Last Dental Exam: Dr. ?, last exam 2021, q53m Last Eye Exam: Dr. [place], last 04/2020, wears glasses Last skin exam: Annually, Dr. Carolynn Sayers NP, last visit 05/2018  Patient Care Team: Unk Pinto, MD as PCP - General (Internal Medicine) Pyrtle, Lajuan Lines, MD as Consulting Physician (Gastroenterology) Megan Salon, MD as Consulting Physician (Gynecology) Druscilla Brownie, MD as Referring Physician (Dermatology)  Surgical History:  She has a past surgical history that includes Dilatation & currettage/hysteroscopy with resectoscope (N/A, 04/10/2013); Laparoscopic cholecystectomy (09-09-2001  dr Dalbert Batman  @[facility] ); Colonoscopy with propofol (08/21/2013); and Laparoscopic bilateral salpingectomy (Right, 05/16/2018). Family History:  Herfamily history includes Arrhythmia in her paternal grandfather; Cerebral aneurysm (age of onset: 26) in  her maternal grandmother; Diabetes in her maternal grandmother and mother; Hypertension in her brother, maternal grandfather, and mother; Obesity in her brother; Peripheral vascular disease in her brother; Stroke (age of onset: 62) in her paternal grandmother. Social History:  She reports that she has never smoked. She has never used smokeless tobacco. She reports that she does not drink alcohol and does not use drugs.  Review of Systems: Review of Systems  Constitutional: Negative for chills, diaphoresis, fever, malaise/fatigue and weight loss.  HENT: Negative for hearing loss and tinnitus.   Eyes: Negative for blurred vision and double vision.  Respiratory: Negative for cough, shortness of breath and wheezing.   Cardiovascular: Negative for chest pain, palpitations, orthopnea, claudication and leg swelling.  Gastrointestinal: Negative for abdominal pain, blood in stool, constipation, diarrhea, heartburn, melena, nausea and vomiting.  Genitourinary: Negative.  Negative for dysuria and urgency.  Musculoskeletal: Negative for joint pain and myalgias.  Skin: Negative for rash.  Neurological: Negative for dizziness, tingling, sensory change, weakness and headaches.  Endo/Heme/Allergies: Negative for polydipsia.  Psychiatric/Behavioral: Negative.  Negative for depression, memory loss and substance abuse. The patient is not nervous/anxious and does not have insomnia.   All other systems reviewed and are negative.   Physical Exam: Estimated body mass  index is 26.63 kg/m as calculated from the following:   Height as of this encounter: 5\' 2"  (1.575 m).   Weight as of this encounter: 145 lb 9.6 oz (66 kg). BP 110/80   Pulse 76   Temp 97.6 F (36.4 C)   Ht 5\' 2"  (1.575 m)   Wt 145 lb 9.6 oz (66 kg)   LMP 06/16/2016   SpO2 100%   BMI 26.63 kg/m  General Appearance: Well nourished, in no apparent distress.  Eyes: PERRLA, EOMs, conjunctiva no swelling or erythema, fundal exam deferred to  ophth  Sinuses: No Frontal/maxillary tenderness  ENT/Mouth: Ext aud canals clear, normal light reflex with TMs without erythema, bulging. Good dentition. No erythema, swelling, or exudate on post pharynx. Tonsils not swollen or erythematous. Hearing normal.  Neck: Supple, thyroid normal. No bruits  Respiratory: Respiratory effort normal, BS equal bilaterally without rales, rhonchi, wheezing or stridor.  Cardio: RRR without murmurs, rubs or gallops. Brisk peripheral pulses without edema.  Chest: symmetric, with normal excursions and percussion.  Breasts: Defer to GYN Abdomen: Soft, nontender, no guarding, rebound, hernias, masses, or organomegaly.  Lymphatics: Non tender without lymphadenopathy.  Genitourinary: Defer to GYN Musculoskeletal: Full ROM all peripheral extremities,5/5 strength, and normal gait.  Skin: Warm, dry without rashes, lesions, ecchymosis. Neuro: Cranial nerves intact, reflexes equal bilaterally. Normal muscle tone, no cerebellar symptoms. Sensation intact.  Psych: Awake and oriented X 3, normal affect, Insight and Judgment appropriate.   EKG: no ST changes, sinus bradycardia   Garnet Sierras, NP 10:29 AM [facility]

## 2020-07-03 LAB — LIPID PANEL
Cholesterol: 184 mg/dL (ref <200)
HDL: 60 mg/dL (ref >=50)
LDL Cholesterol (Calc): 105 mg/dL (calc) — ABNORMAL HIGH
Non-HDL Cholesterol (Calc): 124 mg/dL (calc) (ref <130)
Total CHOL/HDL Ratio: 3.1 (calc) (ref <5.0)
Triglycerides: 101 mg/dL (ref <150)

## 2020-07-03 LAB — COMPLETE METABOLIC PANEL WITH GFR
AG Ratio: 1.8 (calc) (ref 1.0–2.5)
ALT: 12 U/L (ref 6–29)
AST: 16 U/L (ref 10–35)
Albumin: 4.4 g/dL (ref 3.6–5.1)
Alkaline phosphatase (APISO): 82 U/L (ref 37–153)
BUN: 9 mg/dL (ref 7–25)
CO2: 28 mmol/L (ref 20–32)
Calcium: 9.9 mg/dL (ref 8.6–10.4)
Chloride: 102 mmol/L (ref 98–110)
Creat: 0.81 mg/dL (ref 0.50–1.05)
GFR, Est African American: 93 mL/min/{1.73_m2} (ref >=60)
GFR, Est Non African American: 81 mL/min/{1.73_m2} (ref >=60)
Globulin: 2.4 g/dL (calc) (ref 1.9–3.7)
Glucose, Bld: 77 mg/dL (ref 65–99)
Potassium: 4.1 mmol/L (ref 3.5–5.3)
Sodium: 140 mmol/L (ref 135–146)
Total Bilirubin: 0.5 mg/dL (ref 0.2–1.2)
Total Protein: 6.8 g/dL (ref 6.1–8.1)

## 2020-07-03 LAB — CBC WITH DIFFERENTIAL/PLATELET
Absolute Monocytes: 360 cells/uL (ref 200–950)
Basophils Absolute: 78 cells/uL (ref 0–200)
Basophils Relative: 1.3 %
Eosinophils Absolute: 60 cells/uL (ref 15–500)
Eosinophils Relative: 1 %
HCT: 40 % (ref 35.0–45.0)
Hemoglobin: 13.3 g/dL (ref 11.7–15.5)
Lymphs Abs: 1602 cells/uL (ref 850–3900)
MCH: 30.2 pg (ref 27.0–33.0)
MCHC: 33.3 g/dL (ref 32.0–36.0)
MCV: 90.9 fL (ref 80.0–100.0)
MPV: 11.2 fL (ref 7.5–12.5)
Monocytes Relative: 6 %
Neutro Abs: 3900 cells/uL (ref 1500–7800)
Neutrophils Relative %: 65 %
Platelets: 241 10*3/uL (ref 140–400)
RBC: 4.4 10*6/uL (ref 3.80–5.10)
RDW: 11.6 % (ref 11.0–15.0)
Total Lymphocyte: 26.7 %
WBC: 6 10*3/uL (ref 3.8–10.8)

## 2020-07-03 LAB — URINALYSIS W MICROSCOPIC + REFLEX CULTURE
Bacteria, UA: NONE SEEN /HPF
Bilirubin Urine: NEGATIVE
Glucose, UA: NEGATIVE
Hyaline Cast: NONE SEEN /LPF
Ketones, ur: NEGATIVE
Leukocyte Esterase: NEGATIVE
Nitrites, Initial: NEGATIVE
Protein, ur: NEGATIVE
RBC / HPF: NONE SEEN /HPF (ref 0–2)
Specific Gravity, Urine: 1.003 (ref 1.001–1.03)
Squamous Epithelial / HPF: NONE SEEN /HPF (ref <=5)
WBC, UA: NONE SEEN /HPF (ref 0–5)
pH: 6.5 (ref 5.0–8.0)

## 2020-07-03 LAB — HEMOGLOBIN A1C
Hgb A1c MFr Bld: 5.6 % of total Hgb (ref <5.7)
Mean Plasma Glucose: 114 (calc)
eAG (mmol/L): 6.3 (calc)

## 2020-07-03 LAB — NO CULTURE INDICATED

## 2020-07-03 LAB — VITAMIN B12: Vitamin B-12: 564 pg/mL (ref 200–1100)

## 2020-07-03 LAB — VITAMIN D 25 HYDROXY (VIT D DEFICIENCY, FRACTURES): Vit D, 25-Hydroxy: 42 ng/mL (ref 30–100)

## 2020-07-03 LAB — MICROALBUMIN / CREATININE URINE RATIO
Creatinine, Urine: 30 mg/dL (ref 20–275)
Microalb Creat Ratio: 7 mcg/mg creat (ref <30)
Microalb, Ur: 0.2 mg/dL

## 2020-07-03 LAB — IRON, TOTAL/TOTAL IRON BINDING CAP
%SAT: 28 % (calc) (ref 16–45)
Iron: 103 ug/dL (ref 45–160)
TIBC: 368 mcg/dL (calc) (ref 250–450)

## 2020-07-03 LAB — MAGNESIUM: Magnesium: 1.9 mg/dL (ref 1.5–2.5)

## 2020-07-03 LAB — TSH: TSH: 0.95 mIU/L (ref 0.40–4.50)

## 2020-09-01 DIAGNOSIS — Z86018 Personal history of other benign neoplasm: Secondary | ICD-10-CM

## 2020-09-27 ENCOUNTER — Ambulatory Visit: Payer: No Typology Code available for payment source

## 2020-10-14 MED FILL — LOSARTAN-HCTZ 50-12.5 MG TA: 50-12.5 | 30 days supply | Qty: 30 | Fill #0

## 2020-12-04 MED FILL — Losartan Potassium & Hydrochlorothiazide Tab 50-12.5 MG: ORAL | 30 days supply | Qty: 30 | Fill #0 | Status: AC

## 2020-12-05 ENCOUNTER — Other Ambulatory Visit (HOSPITAL_COMMUNITY): Payer: Self-pay

## 2020-12-31 NOTE — Progress Notes (Signed)
FOLLOW UP  Assessment and Plan:   Hypertension Well controlled with current medications; continue hyzaar 50/12.5 mg daily  Monitor blood pressure at home; patient to call if consistently greater than 130/80 Continue DASH diet.   Reminder to go to the ER if any CP, SOB, nausea, dizziness, severe HA, changes vision/speech, left arm numbness and tingling and jaw pain.  Bradycardia Denies any dizziness, fatigue, exercise intolerance Monitor, avoid rate controlling drugs  Cholesterol Mild elevations currently managed by lifestyle Continue low cholesterol diet and exercise.  Check lipid panel.    Other abnormal glucose (hx of prediabetes) Weight trending up Discussed disease and risks Discussed diet/exercise, weight management  Check A1C   Overweight, BMI 27 Commended on weight loss progress, very nearly at goal  Long discussion about weight loss, diet, and exercise Recommended diet heavy in fruits and veggies and low in animal meats, cheeses, and dairy products, appropriate calorie intake Discussed appropriate weight for height (138lb) Follow up at next visit  Vitamin D Def Near goal at last visit;  Taking 1000 IU daily, suggested increase to 2000 IU continue supplementation to maintain goal of 60-100 Defer Vit D level to CPE   Continue diet and meds as discussed. Further disposition pending results of labs. Discussed med's effects and SE's.   Over 30 minutes of exam, counseling, chart review, and critical decision making was performed.   Future Appointments  Date Time Provider [place]  02/12/2021  3:45 PM Megan Salon, MD [facility]  07/01/2021 10:00 AM Garnet Sierras, NP [facility] None  02/13/2022  2:45 PM Megan Salon, MD [facility]    ----------------------------------------------------------------------------------------------------------------------  HPI BP 120/80   Pulse (!) 46   Temp (!) 97.2 F (36.2 C)   Wt 148 lb (67.1 kg)   LMP  06/16/2016   SpO2 98%   BMI 27.07 kg/m   60 y.o. female  presents for 6 month follow up on hypertension, weight and vitamin D deficiency. She has been followed  by Dr. 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  She has been trying to walk with husband but inconsistent, also doing a fitness aerobics by [employer] weekly, pilates weekly as well.  She is thinking about cutting down on meals, admits snacks on potato chips while prepping dinner, needs to stop. Does apple, granola bar, fruit in jello, veggie soup during the day.  Wt Readings from Last 3 Encounters:  01/01/21 148 lb (67.1 kg)  07/02/20 145 lb 9.6 oz (66 kg)  01/02/20 147 lb (66.7 kg)   Her blood pressure has been controlled at home (120s/80s), today their BP is BP: 120/80  She does workout. She denies chest pain, shortness of breath, dizziness.   She is not on cholesterol medication and denies myalgias. Her cholesterol is not at goal. The cholesterol last visit was:   Lab Results  Component Value Date   CHOL 184 07/02/2020   HDL 60 07/02/2020   LDLCALC 105 (H) 07/02/2020   TRIG 101 07/02/2020   CHOLHDL 3.1 07/02/2020   She has been working on diet/exercise; does have hx of prediabetes, A1C 5.7% in 2016. She denies diabetic polys; last A1C was:  Lab Results  Component Value Date   HGBA1C 5.6 07/02/2020   Patient is  newly on Vitamin D supplement, taking 1000 IU daily   Lab Results  Component Value Date   VD25OH 81 07/02/2020        Current Medications:  Current Outpatient Medications on File Prior to Visit  Medication Sig  . CHOLECALCIFEROL PO Take 1,000 Units by mouth daily.  Marland Kitchen losartan-hydrochlorothiazide (HYZAAR) 50-12.5 MG tablet TAKE 1 TABLET BY MOUTH DAILY FOR BLOOD PRESSURE  .  Multiple Vitamin (MULTIVITAMIN) tablet Take 1 tablet by mouth daily.   No current facility-administered medications on file prior to visit.     Allergies: Not on File   Medical History:  Past Medical History:  Diagnosis Date  . History of anemia   . Ovarian neoplasm    right side  . Wears glasses    Family history- Reviewed and unchanged Social history- Reviewed and unchanged   Review of Systems:  Review of Systems  Constitutional: Negative for malaise/fatigue and weight loss.  HENT: Negative for hearing loss and tinnitus.   Eyes: Negative for blurred vision and double vision.  Respiratory: Negative for cough, shortness of breath and wheezing.   Cardiovascular: Negative for chest pain, palpitations, orthopnea, claudication and leg swelling.  Gastrointestinal: Negative for abdominal pain, blood in stool, constipation, diarrhea, heartburn, melena, nausea and vomiting.  Genitourinary: Negative.   Musculoskeletal: Negative for joint pain and myalgias.  Skin: Negative for rash.  Neurological: Negative for dizziness, tingling, sensory change, weakness and headaches.  Endo/Heme/Allergies: Negative for polydipsia.  Psychiatric/Behavioral: Negative.   All other systems reviewed and are negative.    Physical Exam: BP 120/80   Pulse (!) 46   Temp (!) 97.2 F (36.2 C)   Wt 148 lb (67.1 kg)   LMP 06/16/2016   SpO2 98%   BMI 27.07 kg/m  Wt Readings from Last 3 Encounters:  01/01/21 148 lb (67.1 kg)  07/02/20 145 lb 9.6 oz (66 kg)  01/02/20 147 lb (66.7 kg)   General Appearance: Well nourished, in no apparent distress. Eyes: PERRLA, EOMs, conjunctiva no swelling or erythema Sinuses: No Frontal/maxillary tenderness ENT/Mouth: Ext aud canals clear, TMs without erythema, bulging. No erythema, swelling, or exudate on post pharynx.  Tonsils not swollen or erythematous. Hearing normal.  Neck: Supple, thyroid normal.  Respiratory: Respiratory effort normal, BS equal bilaterally  without rales, rhonchi, wheezing or stridor.  Cardio: RRR with no MRGs. Brisk peripheral pulses without edema.  Abdomen: Soft, + BS.  Non tender, no guarding, rebound, hernias, masses. Lymphatics: Non tender without lymphadenopathy.  Musculoskeletal: Full ROM, 5/5 strength, Normal gait Skin: Warm, dry without rashes, lesions, ecchymosis.  Neuro: Cranial nerves intact. No cerebellar symptoms.  Psych: Awake and oriented X 3, normal affect, Insight and Judgment appropriate.    Izora Ribas, NP 4:23 PM [facility]

## 2021-01-01 ENCOUNTER — Encounter: Payer: Self-pay | Admitting: Adult Health

## 2021-01-01 ENCOUNTER — Other Ambulatory Visit: Payer: Self-pay

## 2021-01-01 ENCOUNTER — Ambulatory Visit (INDEPENDENT_AMBULATORY_CARE_PROVIDER_SITE_OTHER): Payer: No Typology Code available for payment source | Admitting: Adult Health

## 2021-01-01 VITALS — BP 120/80 | HR 46 | Temp 97.2°F | Wt 148.0 lb

## 2021-01-01 DIAGNOSIS — I10 Essential (primary) hypertension: Secondary | ICD-10-CM | POA: Diagnosis not present

## 2021-01-01 DIAGNOSIS — Z6825 Body mass index (BMI) 25.0-25.9, adult: Secondary | ICD-10-CM | POA: Diagnosis not present

## 2021-01-01 DIAGNOSIS — E559 Vitamin D deficiency, unspecified: Secondary | ICD-10-CM | POA: Diagnosis not present

## 2021-01-01 DIAGNOSIS — R7309 Other abnormal glucose: Secondary | ICD-10-CM | POA: Diagnosis not present

## 2021-01-01 DIAGNOSIS — Z79899 Other long term (current) drug therapy: Secondary | ICD-10-CM | POA: Diagnosis not present

## 2021-01-01 DIAGNOSIS — E785 Hyperlipidemia, unspecified: Secondary | ICD-10-CM | POA: Diagnosis not present

## 2021-01-01 NOTE — Patient Instructions (Addendum)
Goals    . Blood Pressure < 130/80    . Exercise 150 min/wk Moderate Activity     Aim for 15-20 min walking for every 4 hours of sitting    . LDL CALC < 100    . Weight (lb) < 138 lb (62.6 kg)      Check current snacks - try to choose with low/no added sugar Try to choose options with protein (nuts, beans, seeds) Choose high fiber (fruits/veggies, whole grains, beans)  Might try frozen fruit as snack for variety Veggies with hummus as afternoon snack Try to find alternatives you enjoy to potato chips     High-Fiber Eating Plan Fiber, also called dietary fiber, is a type of carbohydrate. It is found foods such as fruits, vegetables, whole grains, and beans. A high-fiber diet can have many health benefits. Your health care provider may recommend a high-fiber diet to help:  Prevent constipation. Fiber can make your bowel movements more regular.  Lower your cholesterol.  Relieve the following conditions: ? Inflammation of veins in the anus (hemorrhoids). ? Inflammation of specific areas of the digestive tract (uncomplicated diverticulosis). ? A problem of the large intestine, also called the colon, that sometimes causes pain and diarrhea (irritable bowel syndrome, or IBS).  Prevent overeating as part of a weight-loss plan.  Prevent heart disease, type 2 diabetes, and certain cancers. What are tips for following this plan? Reading food labels  Check the nutrition facts label on food products for the amount of dietary fiber. Choose foods that have 5 grams of fiber or more per serving.  The goals for recommended daily fiber intake include: ? Men (age 55 or younger): 34-38 g. ? Men (over age 77): 28-34 g. ? Women (age 33 or younger): 25-28 g. ? Women (over age 71): 22-25 g. Your daily fiber goal is _____________ g.   Shopping  Choose whole fruits and vegetables instead of processed forms, such as apple juice or applesauce.  Choose a wide variety of high-fiber foods such as  avocados, lentils, oats, and kidney beans.  Read the nutrition facts label of the foods you choose. Be aware of foods with added fiber. These foods often have high sugar and sodium amounts per serving. Cooking  Use whole-grain flour for baking and cooking.  Cook with brown rice instead of white rice. Meal planning  Start the day with a breakfast that is high in fiber, such as a cereal that contains 5 g of fiber or more per serving.  Eat breads and cereals that are made with whole-grain flour instead of refined flour or white flour.  Eat brown rice, bulgur wheat, or millet instead of white rice.  Use beans in place of meat in soups, salads, and pasta dishes.  Be sure that half of the grains you eat each day are whole grains. General information  You can get the recommended daily intake of dietary fiber by: ? Eating a variety of fruits, vegetables, grains, nuts, and beans. ? Taking a fiber supplement if you are not able to take in enough fiber in your diet. It is better to get fiber through food than from a supplement.  Gradually increase how much fiber you consume. If you increase your intake of dietary fiber too quickly, you may have bloating, cramping, or gas.  Drink plenty of water to help you digest fiber.  Choose high-fiber snacks, such as berries, raw vegetables, nuts, and popcorn. What foods should I eat? Fruits Berries. Pears. Apples. Oranges.  Avocado. Prunes and raisins. Dried figs. Vegetables Sweet potatoes. Spinach. Kale. Artichokes. Cabbage. Broccoli. Cauliflower. Green peas. Carrots. Squash. Grains Whole-grain breads. Multigrain cereal. Oats and oatmeal. Brown rice. Barley. Bulgur wheat. [place]. Quinoa. Bran muffins. Popcorn. Rye wafer crackers. Meats and other proteins Navy beans, kidney beans, and pinto beans. Soybeans. Split peas. Lentils. Nuts and seeds. Dairy Fiber-fortified yogurt. Beverages Fiber-fortified soy milk. Fiber-fortified orange juice. Other  foods Fiber bars. The items listed above may not be a complete list of recommended foods and beverages. Contact a dietitian for more information. What foods should I avoid? Fruits Fruit juice. Cooked, strained fruit. Vegetables Fried potatoes. Canned vegetables. Well-cooked vegetables. Grains White bread. Pasta made with refined flour. White rice. Meats and other proteins Fatty cuts of meat. Fried chicken or fried fish. Dairy Milk. Yogurt. Cream cheese. Sour cream. Fats and oils Butters. Beverages Soft drinks. Other foods Cakes and pastries. The items listed above may not be a complete list of foods and beverages to avoid. Talk with your dietitian about what choices are best for you. Summary  Fiber is a type of carbohydrate. It is found in foods such as fruits, vegetables, whole grains, and beans.  A high-fiber diet has many benefits. It can help to prevent constipation, lower blood cholesterol, aid weight loss, and reduce your risk of heart disease, diabetes, and certain cancers.  Increase your intake of fiber gradually. Increasing fiber too quickly may cause cramping, bloating, and gas. Drink plenty of water while you increase the amount of fiber you consume.  The best sources of fiber include whole fruits and vegetables, whole grains, nuts, seeds, and beans. This information is not intended to replace advice given to you by your health care provider. Make sure you discuss any questions you have with your health care provider. Document Revised: 12/07/2019 Document Reviewed: 12/07/2019 Elsevier Patient Education  2021 [employer].

## 2021-01-02 LAB — COMPLETE METABOLIC PANEL WITH GFR
AG Ratio: 1.6 (calc) (ref 1.0–2.5)
ALT: 11 U/L (ref 6–29)
AST: 17 U/L (ref 10–35)
Albumin: 4.3 g/dL (ref 3.6–5.1)
Alkaline phosphatase (APISO): 78 U/L (ref 37–153)
BUN: 12 mg/dL (ref 7–25)
CO2: 29 mmol/L (ref 20–32)
Calcium: 9.7 mg/dL (ref 8.6–10.4)
Chloride: 98 mmol/L (ref 98–110)
Creat: 0.8 mg/dL (ref 0.50–1.05)
GFR, Est African American: 95 mL/min/{1.73_m2} (ref >=60)
GFR, Est Non African American: 82 mL/min/{1.73_m2} (ref >=60)
Globulin: 2.7 g/dL (calc) (ref 1.9–3.7)
Glucose, Bld: 83 mg/dL (ref 65–99)
Potassium: 3.7 mmol/L (ref 3.5–5.3)
Sodium: 136 mmol/L (ref 135–146)
Total Bilirubin: 0.5 mg/dL (ref 0.2–1.2)
Total Protein: 7 g/dL (ref 6.1–8.1)

## 2021-01-02 LAB — CBC WITH DIFFERENTIAL/PLATELET
Absolute Monocytes: 374 cells/uL (ref 200–950)
Basophils Absolute: 102 cells/uL (ref 0–200)
Basophils Relative: 1.5 %
Eosinophils Absolute: 82 cells/uL (ref 15–500)
Eosinophils Relative: 1.2 %
HCT: 39.6 % (ref 35.0–45.0)
Hemoglobin: 13 g/dL (ref 11.7–15.5)
Lymphs Abs: 2332 cells/uL (ref 850–3900)
MCH: 29.7 pg (ref 27.0–33.0)
MCHC: 32.8 g/dL (ref 32.0–36.0)
MCV: 90.4 fL (ref 80.0–100.0)
MPV: 11.9 fL (ref 7.5–12.5)
Monocytes Relative: 5.5 %
Neutro Abs: 3910 cells/uL (ref 1500–7800)
Neutrophils Relative %: 57.5 %
Platelets: 244 10*3/uL (ref 140–400)
RBC: 4.38 10*6/uL (ref 3.80–5.10)
RDW: 11.6 % (ref 11.0–15.0)
Total Lymphocyte: 34.3 %
WBC: 6.8 10*3/uL (ref 3.8–10.8)

## 2021-01-02 LAB — LIPID PANEL
Cholesterol: 179 mg/dL (ref <200)
HDL: 60 mg/dL (ref >=50)
LDL Cholesterol (Calc): 97 mg/dL (calc)
Non-HDL Cholesterol (Calc): 119 mg/dL (calc) (ref <130)
Total CHOL/HDL Ratio: 3 (calc) (ref <5.0)
Triglycerides: 121 mg/dL (ref <150)

## 2021-01-02 LAB — HEMOGLOBIN A1C
Hgb A1c MFr Bld: 5.5 % of total Hgb (ref <5.7)
Mean Plasma Glucose: 111 mg/dL
eAG (mmol/L): 6.2 mmol/L

## 2021-01-02 LAB — TSH: TSH: 1.34 mIU/L (ref 0.40–4.50)

## 2021-01-02 LAB — MAGNESIUM: Magnesium: 1.9 mg/dL (ref 1.5–2.5)

## 2021-01-16 ENCOUNTER — Other Ambulatory Visit (HOSPITAL_COMMUNITY): Payer: Self-pay

## 2021-01-16 MED FILL — Losartan Potassium & Hydrochlorothiazide Tab 50-12.5 MG: ORAL | 30 days supply | Qty: 30 | Fill #1 | Status: AC

## 2021-02-12 ENCOUNTER — Ambulatory Visit (INDEPENDENT_AMBULATORY_CARE_PROVIDER_SITE_OTHER): Payer: No Typology Code available for payment source | Admitting: Obstetrics & Gynecology

## 2021-02-12 ENCOUNTER — Other Ambulatory Visit (HOSPITAL_COMMUNITY)
Admission: RE | Admit: 2021-02-12 | Discharge: 2021-02-12 | Disposition: A | Discharge status: Home / Self Care | Payer: No Typology Code available for payment source | Source: Ambulatory Visit | Attending: Obstetrics & Gynecology | Admitting: Obstetrics & Gynecology

## 2021-02-12 ENCOUNTER — Other Ambulatory Visit: Payer: Self-pay

## 2021-02-12 ENCOUNTER — Encounter (HOSPITAL_BASED_OUTPATIENT_CLINIC_OR_DEPARTMENT_OTHER): Payer: Self-pay | Admitting: Obstetrics & Gynecology

## 2021-02-12 VITALS — BP 136/92 | HR 53 | Ht 62.0 in | Wt 145.8 lb

## 2021-02-12 DIAGNOSIS — Z780 Asymptomatic menopausal state: Secondary | ICD-10-CM

## 2021-02-12 DIAGNOSIS — Z124 Encounter for screening for malignant neoplasm of cervix: Secondary | ICD-10-CM | POA: Insufficient documentation

## 2021-02-12 DIAGNOSIS — D251 Intramural leiomyoma of uterus: Secondary | ICD-10-CM

## 2021-02-12 DIAGNOSIS — D4959 Neoplasm of unspecified behavior of other genitourinary organ: Secondary | ICD-10-CM | POA: Diagnosis not present

## 2021-02-12 DIAGNOSIS — Z01419 Encounter for gynecological examination (general) (routine) without abnormal findings: Secondary | ICD-10-CM | POA: Diagnosis not present

## 2021-02-12 NOTE — Progress Notes (Signed)
60 y.o. G0P0000 Married White or Caucasian female here for annual exam.  Doing  well.  Denies vaginal bleeding.  H/o oophorectomy 2019.    Patient's last menstrual period was 06/16/2016.          Sexually active: Yes.    The current method of family planning is post menopausal status.    Smoker:  no  Health Maintenance: Pap:  10/11/2017 Negative History of abnormal Pap:  no MMG:  04/29/2020 Negative Colonoscopy:  08/21/2013 BMD:   04/29/2020 TDaP:  2014 Pneumonia vaccine(s):  not indicated Shingrix:   discussed today Hep C testing: 05/2015 Screening Labs: done with Dr. Gaspar Skeeters in May 2022   reports that she has never smoked. She has never used smokeless tobacco. She reports that she does not drink alcohol and does not use drugs.  Past Medical History:  Diagnosis Date   History of anemia    Ovarian neoplasm    right side   Wears glasses     Past Surgical History:  Procedure Laterality Date   COLONOSCOPY WITH PROPOFOL  08/21/2013   DILATATION & CURRETTAGE/HYSTEROSCOPY WITH RESECTOCOPE N/A 04/10/2013   Procedure: DILATATION & CURETTAGE/HYSTEROSCOPY WITH RESECTOCOPE;  Surgeon: Lyman Speller, MD;  Location: [facility];  Service: Gynecology;  Laterality: N/A;   LAPAROSCOPIC BILATERAL SALPINGECTOMY Right 05/16/2018   Procedure: WITH RIGHT OOPHORECTOMY;  Surgeon: Megan Salon, MD;  [facility]  Service: Gynecology;  Laterality: Right;  possible RSO   LAPAROSCOPIC CHOLECYSTECTOMY  09-09-2001  dr Dalbert Batman  @[email or web address]     Current Outpatient Medications  Medication Sig Dispense Refill   CHOLECALCIFEROL PO Take 2,000 Units by mouth daily.     losartan-hydrochlorothiazide (HYZAAR) 50-12.5 MG tablet TAKE 1 TABLET BY MOUTH DAILY FOR BLOOD PRESSURE 90 tablet 1   Multiple Vitamin (MULTIVITAMIN) tablet Take 1 tablet by mouth daily.     No current facility-administered medications for this visit.    Family History  Problem Relation Age of Onset   Diabetes Maternal  Grandmother    Cerebral aneurysm Maternal Grandmother 62   Hypertension Maternal Grandfather    Arrhythmia Paternal Grandfather    Diabetes Mother    Hypertension Mother    Hypertension Brother    Peripheral vascular disease Brother    Obesity Brother    Stroke Paternal Grandmother 38   Colon cancer Neg Hx    Pancreatic cancer Neg Hx    Stomach cancer Neg Hx    Breast cancer Neg Hx     Review of Systems  All other systems reviewed and are negative.  Exam:   BP (!) 136/92 (BP Location: Right Arm, Patient Position: Sitting, Cuff Size: Small)   Pulse (!) 53   Ht 5\' 2"  (1.575 m)   Wt 145 lb 12.8 oz (66.1 kg)   LMP 06/16/2016   BMI 26.67 kg/m   Height: 5\' 2"  (157.5 cm)  General appearance: alert, cooperative and appears stated age Head: Normocephalic, without obvious abnormality, atraumatic Neck: no adenopathy, supple, symmetrical, trachea midline and thyroid normal to inspection and palpation Lungs: clear to auscultation bilaterally Breasts: normal appearance, no masses or tenderness Heart: regular rate and rhythm Abdomen: soft, non-tender; bowel sounds normal; no masses,  no organomegaly Extremities: extremities normal, atraumatic, no cyanosis or edema Skin: Skin color, texture, turgor normal. No rashes or lesions Lymph nodes: Cervical, supraclavicular, and axillary nodes normal. No abnormal inguinal nodes palpated Neurologic: Grossly normal  Pelvic: External genitalia:  no lesions  Urethra:  normal appearing urethra with no masses, tenderness or lesions              Bartholins and Skenes: normal                 Vagina: normal appearing vagina with normal color and no discharge, no lesions              Cervix: no lesions              Pap taken: Yes.   Bimanual Exam:  Uterus:  normal size, contour, position, consistency, mobility, non-tender              Adnexa: normal adnexa and no mass, fullness, tenderness               Rectovaginal: Confirms                Anus:  normal sphincter tone, no lesions  Chaperone, Octaviano Batty, CMA, was present for exam.  Assessment/Plan: 1. Well woman exam with routine gynecological exam - pap smear obtained today - MMG 04/2020 - Colonoscopy 08/21/2013 - BMD 2021 - vaccines updated - lab work done with Dr. Idell Pickles office  2. Cervical cancer screening - Cytology - PAP( [place])  3. 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 Intramural leiomyoma of uterus - uterus stable in size on exam today.  No imaging recommended today  5. Postmenopausal - no HRT

## 2021-02-14 LAB — CYTOLOGY - PAP
Comment: NEGATIVE
Diagnosis: NEGATIVE
High risk HPV: NEGATIVE

## 2021-02-15 DIAGNOSIS — Z780 Asymptomatic menopausal state: Secondary | ICD-10-CM | POA: Insufficient documentation

## 2021-03-16 MED FILL — Losartan Potassium & Hydrochlorothiazide Tab 50-12.5 MG: ORAL | 30 days supply | Qty: 30 | Fill #2 | Status: AC

## 2021-03-17 ENCOUNTER — Other Ambulatory Visit (HOSPITAL_COMMUNITY): Payer: Self-pay

## 2021-04-07 ENCOUNTER — Other Ambulatory Visit: Payer: Self-pay | Admitting: Obstetrics & Gynecology

## 2021-04-07 DIAGNOSIS — Z1231 Encounter for screening mammogram for malignant neoplasm of breast: Secondary | ICD-10-CM

## 2021-04-29 MED FILL — Losartan Potassium & Hydrochlorothiazide Tab 50-12.5 MG: ORAL | 30 days supply | Qty: 30 | Fill #3 | Status: AC

## 2021-04-30 ENCOUNTER — Other Ambulatory Visit (HOSPITAL_COMMUNITY): Payer: Self-pay

## 2021-05-06 ENCOUNTER — Ambulatory Visit
Admission: RE | Admit: 2021-05-06 | Discharge: 2021-05-06 | Disposition: A | Discharge status: Home / Self Care | Payer: No Typology Code available for payment source | Source: Ambulatory Visit

## 2021-05-06 ENCOUNTER — Other Ambulatory Visit: Payer: Self-pay

## 2021-05-06 DIAGNOSIS — Z1231 Encounter for screening mammogram for malignant neoplasm of breast: Secondary | ICD-10-CM

## 2021-06-22 MED FILL — Losartan Potassium & Hydrochlorothiazide Tab 50-12.5 MG: ORAL | 30 days supply | Qty: 30 | Fill #4 | Status: AC

## 2021-06-23 ENCOUNTER — Other Ambulatory Visit (HOSPITAL_COMMUNITY): Payer: Self-pay

## 2021-07-01 ENCOUNTER — Encounter: Payer: No Typology Code available for payment source | Admitting: Adult Health Nurse Practitioner

## 2021-07-01 ENCOUNTER — Encounter: Payer: Self-pay | Admitting: Adult Health

## 2021-07-01 NOTE — Progress Notes (Signed)
COMPLETE PHYSICAL  Assessment and Plan:  Nancy Carter was seen today for annual exam   Diagnoses and all orders for this visit:  Encounter for Annual Physical Exam with abnormal findings Due annually  Health Maintenance reviewed Healthy lifestyle reviewed and goals set  Essential hypertension Continue medications at goal; patient is RN and monitors closely  Monitor blood pressure at home; call if consistently over 130/80 Discussed DASH diet Advised to go to the ER if any CP, SOB, nausea, dizziness, severe HA, changes vision/speech, left arm numbness and tingling and jaw pain. - EKG  Intramural leiomyoma of uterus      -       Continue f/u with GYN as recommended   Benign microscopic hematuria Has seen urology without further workup recommended Low risk hx Continue to monitor  UA, microalbumin  Screening cholesterol level -     Lipid panel  Screening for diabetes mellitus/ hx of prediabetes Discussed diet/exercise, weight management  -     Hemoglobin A1c  Screening for thyroid disorder -     TSH  Vitamin D deficiency -     VITAMIN D 25 Hydroxy (Vit-D Deficiency, Fractures)  BMI 27 Continue to recommend diet heavy in fruits and veggies and low in animal meats, cheeses, and dairy products, appropriate calorie intake Discuss exercise recommendations routinely, add walking daily, resistance exercises for muscle mass reviewed; weight goal 136 lb Continue to monitor weight at each visit  Medication management Continued  Discussed med's effects and SE's. Screening labs and tests as requested with regular follow-up as recommended. Over 40 minutes face to face interview,  exam, counseling, chart review, and complex, high level critical decision making was performed this visit.   Future Appointments  Date Time Provider [place]  02/13/2022  8:30 AM Megan Salon, MD [facility]  07/02/2022  3:00 PM Liane Comber, NP [facility] None    HPI  60 y.o. Caucasian  female, presents for a complete physical and follow up for has Intramural leiomyoma of uterus; Hypertension; H/O dysplastic nevus; Bradycardia; Overweight (BMI 25.0-29.9); Asymptomatic microscopic hematuria; Hyperlipidemia; Vitamin D deficiency; Other abnormal glucose (hx of prediabetes); and Postmenopausal on their problem list.   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 She has no concerns today.   She has been followed  by Dr. 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   She sees dermatology Dr. Carolynn Sayers, NP annually for history of dysplastic nevus.   She has had ongoing asymptomatic microscopic hematuria for which she saw urology and was not recommended workup.   BMI is Body mass index is 27.03 kg/m., she reports has changed jobs, more sedentary/office work rather than on her feet all day.  She has been trying to walk with husband but inconsistent, has started senior sneakers twice weekly.  She is thinking about cutting down on meals, has cut down on potato chips. Doing more fruit. Has been doing protein/fiber bar.  Wt Readings from Last 3 Encounters:  07/02/21 149 lb (67.6 kg)  02/12/21 145 lb 12.8 oz (66.1 kg)  01/01/21 148 lb (67.1 kg)   Her blood pressure has been controlled at home, today their BP is BP: 110/76 She does workout. She denies chest pain, shortness of breath, dizziness.   She is not on cholesterol medication and denies myalgias. Her cholesterol is at goal. The cholesterol last visit  was:   Lab Results  Component Value Date   CHOL 179 01/01/2021   HDL 60 01/01/2021   LDLCALC 97 01/01/2021   TRIG 121 01/01/2021   CHOLHDL 3.0 01/01/2021   She has hx of prediabetes (5.7% in 05/2015) reversed by lifestyle modification; last A1C in the office was:  Lab Results  Component Value Date   HGBA1C 5.5  01/01/2021   Last GFR: Lab Results  Component Value Date   GFRNONAA 82 01/01/2021   Patient in on vitamin D supplement, taking 1000 IU daily:  Lab Results  Component Value Date   VD25OH 42 07/02/2020     Current Medications:  Current Outpatient Medications on File Prior to Visit  Medication Sig Dispense Refill   CHOLECALCIFEROL PO Take 2,000 Units by mouth daily.     losartan-hydrochlorothiazide (HYZAAR) 50-12.5 MG tablet TAKE 1 TABLET BY MOUTH DAILY FOR BLOOD PRESSURE 90 tablet 1   Multiple Vitamin (MULTIVITAMIN) tablet Take 1 tablet by mouth daily.     No current facility-administered medications on file prior to visit.   Allergies:  Not on File Medical History:  She has Intramural leiomyoma of uterus; Hypertension; H/O dysplastic nevus; Bradycardia; Overweight (BMI 25.0-29.9); Asymptomatic microscopic hematuria; Hyperlipidemia; Vitamin D deficiency; Other abnormal glucose (hx of prediabetes); and Postmenopausal on their problem list. Health Maintenance:   Immunization History  Administered Date(s) Administered   Influenza-Unspecified 05/17/2016, 04/22/2018, 05/17/2018, 05/11/2019, 06/12/2021   Td 05/14/2006   Tdap 06/08/2013   Tetanus: 2014 Pneumovax: N/A Prevnar 13: N/A Flu vaccine: 05/2021 Shingrix: Declines  Covid 19: had 2/2, 2021 pfizer -dates requested  Pap: 02/12/2021 normal with Dr. Sabra Heck MGM: 05/06/2021 DEXA: N/A  Colonoscopy: 08/2013 due in 10 years EGD: N/A  Last Dental Exam: [facility], last exam 2022, q68m Last Eye Exam: Dr. [place], last 04/2020, wears glasses, has scheduled 08/2021 Last skin exam: Annually, Dr. Carolynn Sayers NP, has scheduled 07/29/2021  Patient Care Team: Unk Pinto, MD as PCP - General (Internal Medicine) Pyrtle, Lajuan Lines, MD as Consulting Physician (Gastroenterology) Megan Salon, MD as Consulting Physician (Gynecology) Druscilla Brownie, MD as Referring Physician (Dermatology)  Surgical History:  She has a past  surgical history that includes Dilatation & currettage/hysteroscopy with resectoscope (N/A, 04/10/2013); Laparoscopic cholecystectomy (09-09-2001  dr Dalbert Batman  @[facility] ); and Laparoscopic bilateral salpingectomy (Right, 05/16/2018). Family History:  Nancy Carter history includes Arrhythmia in her paternal grandfather; Cerebral aneurysm (age of onset: 83) in her maternal grandmother; Diabetes in her maternal grandmother and mother; Hypertension in her brother, maternal grandfather, and mother; Obesity in her brother; Peripheral vascular disease in her brother; Stroke (age of onset: 21) in her paternal grandmother. Social History:  She reports that she has never smoked. She has never used smokeless tobacco. She reports that she does not drink alcohol and does not use drugs.  Review of Systems: Review of Systems  Constitutional:  Negative for chills, diaphoresis, fever, malaise/fatigue and weight loss.  HENT:  Negative for hearing loss and tinnitus.   Eyes:  Negative for blurred vision and double vision.  Respiratory:  Negative for cough, shortness of breath and wheezing.   Cardiovascular:  Negative for chest pain, palpitations, orthopnea, claudication and leg swelling.  Gastrointestinal:  Negative for abdominal pain, blood in stool, constipation, diarrhea, heartburn, melena, nausea and vomiting.  Genitourinary: Negative.  Negative for dysuria and urgency.  Musculoskeletal:  Negative for joint pain and myalgias.  Skin:  Negative for rash.  Neurological:  Negative for dizziness, tingling, sensory change, weakness and headaches.  Endo/Heme/Allergies:  Negative for polydipsia.  Psychiatric/Behavioral: Negative.  Negative for depression, memory loss and substance abuse. The patient is not nervous/anxious and does not have insomnia.   All other systems reviewed and are negative.  Physical Exam: Estimated body mass index is 27.03 kg/m as calculated from the following:   Height as of this encounter: 5' 2.25"  (1.581 m).   Weight as of this encounter: 149 lb (67.6 kg). BP 110/76   Pulse (!) 53   Temp 97.9 F (36.6 C)   Ht 5' 2.25" (1.581 m)   Wt 149 lb (67.6 kg)   LMP 06/16/2016   SpO2 99%   BMI 27.03 kg/m  General Appearance: Well nourished, in no apparent distress.  Eyes: PERRLA, EOMs, conjunctiva no swelling or erythema Sinuses: No Frontal/maxillary tenderness  ENT/Mouth: Ext aud canals clear, normal light reflex with TMs without erythema, bulging. Good dentition. No erythema, swelling, or exudate on post pharynx. Tonsils not swollen or erythematous. Hearing normal.  Neck: Supple, thyroid normal. No bruits  Respiratory: Respiratory effort normal, BS equal bilaterally without rales, rhonchi, wheezing or stridor.  Cardio: RRR without murmurs, rubs or gallops. Brisk peripheral pulses without edema.  Chest: symmetric, with normal excursions and percussion.  Breasts: Defer to GYN Abdomen: Soft, nontender, no guarding, rebound, hernias, masses, or organomegaly.  Lymphatics: Non tender without lymphadenopathy.  Genitourinary: Defer to GYN Musculoskeletal: Full ROM all peripheral extremities,5/5 strength, and normal gait.  Skin: Warm, dry without rashes, lesions, ecchymosis. Neuro: Cranial nerves intact, reflexes equal bilaterally. Normal muscle tone, no cerebellar symptoms. Sensation intact.  Psych: Awake and oriented X 3, normal affect, Insight and Judgment appropriate.   EKG: no ST changes, sinus bradycardia   Izora Ribas, NP 3:19 PM [facility]

## 2021-07-02 ENCOUNTER — Ambulatory Visit (INDEPENDENT_AMBULATORY_CARE_PROVIDER_SITE_OTHER): Payer: No Typology Code available for payment source | Admitting: Adult Health

## 2021-07-02 ENCOUNTER — Encounter: Payer: Self-pay | Admitting: Adult Health

## 2021-07-02 ENCOUNTER — Other Ambulatory Visit: Payer: Self-pay

## 2021-07-02 VITALS — BP 110/76 | HR 53 | Temp 97.9°F | Ht 62.25 in | Wt 149.0 lb

## 2021-07-02 DIAGNOSIS — Z79899 Other long term (current) drug therapy: Secondary | ICD-10-CM

## 2021-07-02 DIAGNOSIS — D251 Intramural leiomyoma of uterus: Secondary | ICD-10-CM

## 2021-07-02 DIAGNOSIS — I10 Essential (primary) hypertension: Secondary | ICD-10-CM

## 2021-07-02 DIAGNOSIS — Z1329 Encounter for screening for other suspected endocrine disorder: Secondary | ICD-10-CM

## 2021-07-02 DIAGNOSIS — Z0001 Encounter for general adult medical examination with abnormal findings: Secondary | ICD-10-CM

## 2021-07-02 DIAGNOSIS — E559 Vitamin D deficiency, unspecified: Secondary | ICD-10-CM

## 2021-07-02 DIAGNOSIS — Z1389 Encounter for screening for other disorder: Secondary | ICD-10-CM

## 2021-07-02 DIAGNOSIS — R7309 Other abnormal glucose: Secondary | ICD-10-CM

## 2021-07-02 DIAGNOSIS — Z13220 Encounter for screening for lipoid disorders: Secondary | ICD-10-CM

## 2021-07-02 DIAGNOSIS — Z86018 Personal history of other benign neoplasm: Secondary | ICD-10-CM

## 2021-07-02 DIAGNOSIS — Z136 Encounter for screening for cardiovascular disorders: Secondary | ICD-10-CM | POA: Diagnosis not present

## 2021-07-02 DIAGNOSIS — E663 Overweight: Secondary | ICD-10-CM

## 2021-07-02 DIAGNOSIS — E785 Hyperlipidemia, unspecified: Secondary | ICD-10-CM

## 2021-07-02 DIAGNOSIS — Z131 Encounter for screening for diabetes mellitus: Secondary | ICD-10-CM | POA: Diagnosis not present

## 2021-07-02 DIAGNOSIS — R001 Bradycardia, unspecified: Secondary | ICD-10-CM

## 2021-07-02 DIAGNOSIS — Z780 Asymptomatic menopausal state: Secondary | ICD-10-CM

## 2021-07-02 DIAGNOSIS — R3121 Asymptomatic microscopic hematuria: Secondary | ICD-10-CM

## 2021-07-02 DIAGNOSIS — Z0000 Encounter for general adult medical examination without abnormal findings: Secondary | ICD-10-CM

## 2021-07-02 NOTE — Patient Instructions (Addendum)
  Nancy Carter , Thank you for taking time to come for your Annual Wellness Visit. I appreciate your ongoing commitment to your health goals. Please review the following plan we discussed and let me know if I can assist you in the future.   These are the goals we discussed:  Goals      Blood Pressure < 130/80     Exercise 150 min/wk Moderate Activity     Aim for 15-20 min walking for every 4 hours of sitting Impact or resistance exercises every other day     LDL CALC < 100     Weight (lb) < 138 lb (62.6 kg)        This is a list of the screening recommended for you and due dates:  Health Maintenance  Topic Date Due   Zoster (Shingles) Vaccine (1 of 2) 07/02/2025*   Pneumococcal Vaccination (1 - PCV) 07/02/2028*   Mammogram  05/07/2023   Tetanus Vaccine  06/09/2023   Colon Cancer Screening  08/22/2023   Pap Smear  02/13/2024   Flu Shot  Completed   Hepatitis C Screening: USPSTF Recommendation to screen - Ages 18-79 yo.  Completed   HIV Screening  Completed   HPV Vaccine  Aged Out  *Topic was postponed. The date shown is not the original due date.    Please send covid 19 dates via mychart     Know what a healthy weight is for you (roughly BMI <25) and aim to maintain this  Aim for 7+ servings of fruits and vegetables daily  65-80+ fluid ounces of water or unsweet tea for healthy kidneys  Limit to max 1 drink of alcohol per day; avoid smoking/tobacco  Limit animal fats in diet for cholesterol and heart health - choose grass fed whenever available  Avoid highly processed foods, and foods high in saturated/trans fats  Aim for low stress - take time to unwind and care for your mental health  Aim for 150 min of moderate intensity exercise weekly for heart health, and weights twice weekly for bone health  Aim for 7-9 hours of sleep daily     A great goal to work towards is aiming to get in a serving daily of some of the most nutritionally dense foods - G- BOMBS daily

## 2021-07-03 ENCOUNTER — Other Ambulatory Visit: Payer: Self-pay | Admitting: Adult Health

## 2021-07-03 LAB — URINALYSIS, ROUTINE W REFLEX MICROSCOPIC
Bacteria, UA: NONE SEEN /HPF
Bilirubin Urine: NEGATIVE
Glucose, UA: NEGATIVE
Hyaline Cast: NONE SEEN /LPF
Ketones, ur: NEGATIVE
Leukocytes,Ua: NEGATIVE
Nitrite: NEGATIVE
Protein, ur: NEGATIVE
RBC / HPF: NONE SEEN /HPF (ref 0–2)
Specific Gravity, Urine: 1.004 (ref 1.001–1.035)
Squamous Epithelial / HPF: NONE SEEN /HPF (ref <=5)
WBC, UA: NONE SEEN /HPF (ref 0–5)
pH: 6.5 (ref 5.0–8.0)

## 2021-07-03 LAB — MICROALBUMIN / CREATININE URINE RATIO
Creatinine, Urine: 29 mg/dL (ref 20–275)
Microalb, Ur: <0.2 mg/dL

## 2021-07-03 LAB — COMPLETE METABOLIC PANEL WITH GFR
AG Ratio: 1.6 (calc) (ref 1.0–2.5)
ALT: 11 U/L (ref 6–29)
AST: 16 U/L (ref 10–35)
Albumin: 4.2 g/dL (ref 3.6–5.1)
Alkaline phosphatase (APISO): 83 U/L (ref 37–153)
BUN: 10 mg/dL (ref 7–25)
CO2: 30 mmol/L (ref 20–32)
Calcium: 9.4 mg/dL (ref 8.6–10.4)
Chloride: 98 mmol/L (ref 98–110)
Creat: 0.97 mg/dL (ref 0.50–1.03)
Globulin: 2.7 g/dL (calc) (ref 1.9–3.7)
Glucose, Bld: 79 mg/dL (ref 65–99)
Potassium: 4 mmol/L (ref 3.5–5.3)
Sodium: 137 mmol/L (ref 135–146)
Total Bilirubin: 0.5 mg/dL (ref 0.2–1.2)
Total Protein: 6.9 g/dL (ref 6.1–8.1)
eGFR: 68 mL/min/{1.73_m2} (ref >=60)

## 2021-07-03 LAB — CBC WITH DIFFERENTIAL/PLATELET
Absolute Monocytes: 454 cells/uL (ref 200–950)
Basophils Absolute: 90 cells/uL (ref 0–200)
Basophils Relative: 1.4 %
Eosinophils Absolute: 160 cells/uL (ref 15–500)
Eosinophils Relative: 2.5 %
HCT: 38.8 % (ref 35.0–45.0)
Hemoglobin: 13.1 g/dL (ref 11.7–15.5)
Lymphs Abs: 1984 cells/uL (ref 850–3900)
MCH: 30.5 pg (ref 27.0–33.0)
MCHC: 33.8 g/dL (ref 32.0–36.0)
MCV: 90.2 fL (ref 80.0–100.0)
MPV: 11.7 fL (ref 7.5–12.5)
Monocytes Relative: 7.1 %
Neutro Abs: 3712 cells/uL (ref 1500–7800)
Neutrophils Relative %: 58 %
Platelets: 222 10*3/uL (ref 140–400)
RBC: 4.3 10*6/uL (ref 3.80–5.10)
RDW: 11.8 % (ref 11.0–15.0)
Total Lymphocyte: 31 %
WBC: 6.4 10*3/uL (ref 3.8–10.8)

## 2021-07-03 LAB — LIPID PANEL
Cholesterol: 190 mg/dL (ref <200)
HDL: 63 mg/dL (ref >=50)
LDL Cholesterol (Calc): 106 mg/dL (calc) — ABNORMAL HIGH
Non-HDL Cholesterol (Calc): 127 mg/dL (calc) (ref <130)
Total CHOL/HDL Ratio: 3 (calc) (ref <5.0)
Triglycerides: 109 mg/dL (ref <150)

## 2021-07-03 LAB — HEMOGLOBIN A1C
Hgb A1c MFr Bld: 5.5 % of total Hgb (ref <5.7)
Mean Plasma Glucose: 111 mg/dL
eAG (mmol/L): 6.2 mmol/L

## 2021-07-03 LAB — TSH: TSH: 1.17 mIU/L (ref 0.40–4.50)

## 2021-07-03 LAB — MICROSCOPIC MESSAGE

## 2021-07-03 LAB — VITAMIN D 25 HYDROXY (VIT D DEFICIENCY, FRACTURES): Vit D, 25-Hydroxy: 38 ng/mL (ref 30–100)

## 2021-07-03 LAB — MAGNESIUM: Magnesium: 1.8 mg/dL (ref 1.5–2.5)

## 2021-07-03 MED ORDER — CHOLECALCIFEROL 50 MCG (2000 UT) PO CAPS: 2000.0000 [IU] | ORAL_CAPSULE | Freq: Every day | ORAL | Status: AC

## 2021-07-30 ENCOUNTER — Other Ambulatory Visit: Payer: Self-pay | Admitting: Adult Health Nurse Practitioner

## 2021-07-30 ENCOUNTER — Other Ambulatory Visit: Payer: Self-pay

## 2021-07-30 DIAGNOSIS — I10 Essential (primary) hypertension: Secondary | ICD-10-CM

## 2021-07-31 ENCOUNTER — Other Ambulatory Visit (HOSPITAL_COMMUNITY): Payer: Self-pay

## 2021-07-31 MED FILL — Losartan Potassium & Hydrochlorothiazide Tab 50-12.5 MG: ORAL | 90 days supply | Qty: 90 | Fill #0 | Status: AC

## 2021-08-01 ENCOUNTER — Other Ambulatory Visit (HOSPITAL_COMMUNITY): Payer: Self-pay

## 2021-08-01 ENCOUNTER — Other Ambulatory Visit: Payer: Self-pay | Admitting: Adult Health Nurse Practitioner

## 2021-08-01 DIAGNOSIS — I10 Essential (primary) hypertension: Secondary | ICD-10-CM

## 2021-08-06 ENCOUNTER — Other Ambulatory Visit (HOSPITAL_COMMUNITY): Payer: Self-pay

## 2021-12-10 MED FILL — Losartan Potassium & Hydrochlorothiazide Tab 50-12.5 MG: ORAL | 90 days supply | Qty: 90 | Fill #1 | Status: AC

## 2021-12-11 ENCOUNTER — Other Ambulatory Visit (HOSPITAL_COMMUNITY): Payer: Self-pay

## 2021-12-16 ENCOUNTER — Ambulatory Visit (HOSPITAL_BASED_OUTPATIENT_CLINIC_OR_DEPARTMENT_OTHER): Payer: No Typology Code available for payment source | Admitting: Obstetrics & Gynecology

## 2022-02-13 ENCOUNTER — Ambulatory Visit (HOSPITAL_BASED_OUTPATIENT_CLINIC_OR_DEPARTMENT_OTHER): Payer: No Typology Code available for payment source | Admitting: Obstetrics & Gynecology

## 2022-02-13 ENCOUNTER — Other Ambulatory Visit: Payer: Self-pay | Admitting: Obstetrics & Gynecology

## 2022-02-13 DIAGNOSIS — Z1231 Encounter for screening mammogram for malignant neoplasm of breast: Secondary | ICD-10-CM

## 2022-04-02 ENCOUNTER — Other Ambulatory Visit: Payer: Self-pay

## 2022-04-07 ENCOUNTER — Other Ambulatory Visit (HOSPITAL_COMMUNITY): Payer: Self-pay

## 2022-04-09 ENCOUNTER — Encounter: Payer: Self-pay | Admitting: Internal Medicine

## 2022-04-09 ENCOUNTER — Other Ambulatory Visit: Payer: Self-pay | Admitting: Internal Medicine

## 2022-04-09 ENCOUNTER — Other Ambulatory Visit (HOSPITAL_COMMUNITY): Payer: Self-pay

## 2022-04-09 DIAGNOSIS — I10 Essential (primary) hypertension: Secondary | ICD-10-CM

## 2022-04-09 MED ORDER — LOSARTAN POTASSIUM-HCTZ 50-12.5 MG PO TABS
1.0000 | ORAL_TABLET | Freq: Every day | ORAL | 1 refills | Status: DC
  Filled 2022-04-09: qty 90, 90d supply, fill #0

## 2022-04-10 ENCOUNTER — Other Ambulatory Visit (HOSPITAL_COMMUNITY): Payer: Self-pay

## 2022-05-08 ENCOUNTER — Ambulatory Visit
Admission: RE | Admit: 2022-05-08 | Discharge: 2022-05-08 | Disposition: A | Discharge status: Home / Self Care | Payer: No Typology Code available for payment source | Source: Ambulatory Visit

## 2022-05-08 DIAGNOSIS — Z1231 Encounter for screening mammogram for malignant neoplasm of breast: Secondary | ICD-10-CM

## 2022-05-26 ENCOUNTER — Ambulatory Visit (INDEPENDENT_AMBULATORY_CARE_PROVIDER_SITE_OTHER): Payer: No Typology Code available for payment source | Admitting: Obstetrics & Gynecology

## 2022-05-26 ENCOUNTER — Encounter (HOSPITAL_BASED_OUTPATIENT_CLINIC_OR_DEPARTMENT_OTHER): Payer: Self-pay | Admitting: Obstetrics & Gynecology

## 2022-05-26 VITALS — BP 130/80 | HR 49 | Ht 62.0 in | Wt 143.0 lb

## 2022-05-26 DIAGNOSIS — Z90721 Acquired absence of ovaries, unilateral: Secondary | ICD-10-CM

## 2022-05-26 DIAGNOSIS — Z01419 Encounter for gynecological examination (general) (routine) without abnormal findings: Secondary | ICD-10-CM | POA: Diagnosis not present

## 2022-05-26 DIAGNOSIS — Z9079 Acquired absence of other genital organ(s): Secondary | ICD-10-CM

## 2022-05-26 DIAGNOSIS — D251 Intramural leiomyoma of uterus: Secondary | ICD-10-CM | POA: Diagnosis not present

## 2022-05-26 DIAGNOSIS — I10 Essential (primary) hypertension: Secondary | ICD-10-CM | POA: Diagnosis not present

## 2022-05-26 DIAGNOSIS — Z780 Asymptomatic menopausal state: Secondary | ICD-10-CM | POA: Diagnosis not present

## 2022-05-26 NOTE — Progress Notes (Signed)
61 y.o. G0P0000 Married White or Caucasian female here for annual exam.  Doing well.  Denies vaginal bleeding.    Patient's last menstrual period was 05/17/2016 (approximate).          Sexually active: No The current method of family planning is post menopausal status.    Smoker:  no  Health Maintenance: Pap:  01/2021 Negative History of abnormal Pap:  once with normal follow up MMG:  05/08/2022 Negative Colonoscopy:  08/21/2013 BMD:   04/29/2020 Screening Labs: 06/2021   reports that she has never smoked. She has never used smokeless tobacco. She reports that she does not drink alcohol and does not use drugs.  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   S/p oopherectomy 04/2018   Ovarian neoplasm    right side   Wears glasses     Past Surgical History:  Procedure Laterality Date   DILATATION & CURRETTAGE/HYSTEROSCOPY WITH RESECTOCOPE N/A 04/10/2013   Procedure: DILATATION & CURETTAGE/HYSTEROSCOPY WITH RESECTOCOPE;  Surgeon: Lyman Speller, MD;  Location: [facility];  Service: Gynecology;  Laterality: N/A;   LAPAROSCOPIC BILATERAL SALPINGECTOMY Right 05/16/2018   Procedure: WITH RIGHT OOPHORECTOMY;  Surgeon: Megan Salon, MD;  Location: [facility];  Service: Gynecology;  Laterality: Right;  possible RSO   LAPAROSCOPIC CHOLECYSTECTOMY  09-09-2001  dr Dalbert Batman  '@[facility]'$     Current Outpatient Medications  Medication Sig Dispense Refill   Cholecalciferol 50 MCG (2000 UT) CAPS Take 1 capsule (2,000 Units total) by mouth daily.     losartan-hydrochlorothiazide (HYZAAR) 50-12.5 MG tablet Take 1 tablet by mouth daily for blood pressure 90 tablet 1   Multiple Vitamin (MULTIVITAMIN) tablet Take 1 tablet by mouth daily.     No current facility-administered medications for this visit.    Family History  Problem Relation Age of Onset   Diabetes Maternal Grandmother    Cerebral aneurysm Maternal Grandmother 51    Hypertension Maternal Grandfather    Arrhythmia Paternal Grandfather    Diabetes Mother    Hypertension Mother    Hypertension Brother    Peripheral vascular disease Brother    Obesity Brother    Stroke Paternal Grandmother 61   Colon cancer Neg Hx    Pancreatic cancer Neg Hx    Stomach cancer Neg Hx    Breast cancer Neg Hx     ROS: Constitutional: negative Genitourinary:negative  Exam:   BP 130/80 (BP Location: Right Arm, Patient Position: Sitting, Cuff Size: Large)   Pulse (!) 49   Ht '5\' 2"'$  (1.575 m) Comment: Reported  Wt 143 lb (64.9 kg)   LMP 05/17/2016 (Approximate)   BMI 26.16 kg/m   Height: '5\' 2"'$  (157.5 cm) (Reported)  General appearance: alert, cooperative and appears stated age Head: Normocephalic, without obvious abnormality, atraumatic Neck: no adenopathy, supple, symmetrical, trachea midline and thyroid normal to inspection and palpation Lungs: clear to auscultation bilaterally Breasts: normal appearance, no masses or tenderness Heart: regular rate and rhythm Abdomen: soft, non-tender; bowel sounds normal; no masses,  no organomegaly Extremities: extremities normal, atraumatic, no cyanosis or edema Skin: Skin color, texture, turgor normal. No rashes or lesions Lymph nodes: Cervical, supraclavicular, and axillary nodes normal. No abnormal inguinal nodes palpated Neurologic: Grossly normal   Pelvic: External genitalia:  no lesions              Urethra:  normal appearing urethra with no masses, tenderness or lesions  Bartholins and Skenes: normal                 Vagina: normal appearing vagina with normal color and no discharge, no lesions              Cervix: no lesions              Pap taken: No. Bimanual Exam:  Uterus:  normal size, contour, position, consistency, mobility, non-tender              Adnexa: normal adnexa and no mass, fullness, tenderness               Rectovaginal: Confirms               Anus:  normal sphincter tone, no  lesions  Chaperone, Octaviano Batty, CMA, was present for exam.  Assessment/Plan: 1. Well woman exam with routine gynecological exam - Pap smear neg with neg HR HPV 2022 - Mammogram done 04/2022 - Colonoscopy 2015.  Follow up 10 years. - Bone mineral density normal 2021 - lab work done done with PCP - vaccines reviewed/updated  2. Essential hypertension - Ambulatory referral to Cardiology  3. Postmenopausal  4. Intramural leiomyoma of uterus  5. History of right salpingo-oophorectomy - RSO and left salpingectomy with cystadenofibroma on pathology 2019

## 2022-06-26 ENCOUNTER — Other Ambulatory Visit (HOSPITAL_COMMUNITY): Payer: Self-pay

## 2022-07-02 ENCOUNTER — Encounter: Payer: Self-pay | Admitting: Nurse Practitioner

## 2022-07-02 ENCOUNTER — Ambulatory Visit (INDEPENDENT_AMBULATORY_CARE_PROVIDER_SITE_OTHER): Payer: No Typology Code available for payment source | Admitting: Nurse Practitioner

## 2022-07-02 VITALS — BP 126/88 | HR 82 | Temp 97.1°F | Ht 62.25 in | Wt 142.8 lb

## 2022-07-02 DIAGNOSIS — Z136 Encounter for screening for cardiovascular disorders: Secondary | ICD-10-CM

## 2022-07-02 DIAGNOSIS — Z131 Encounter for screening for diabetes mellitus: Secondary | ICD-10-CM | POA: Diagnosis not present

## 2022-07-02 DIAGNOSIS — Z13220 Encounter for screening for lipoid disorders: Secondary | ICD-10-CM

## 2022-07-02 DIAGNOSIS — E663 Overweight: Secondary | ICD-10-CM

## 2022-07-02 DIAGNOSIS — I10 Essential (primary) hypertension: Secondary | ICD-10-CM | POA: Diagnosis not present

## 2022-07-02 DIAGNOSIS — R311 Benign essential microscopic hematuria: Secondary | ICD-10-CM

## 2022-07-02 DIAGNOSIS — Z0001 Encounter for general adult medical examination with abnormal findings: Secondary | ICD-10-CM

## 2022-07-02 DIAGNOSIS — Z1389 Encounter for screening for other disorder: Secondary | ICD-10-CM | POA: Diagnosis not present

## 2022-07-02 DIAGNOSIS — E559 Vitamin D deficiency, unspecified: Secondary | ICD-10-CM

## 2022-07-02 DIAGNOSIS — Z1329 Encounter for screening for other suspected endocrine disorder: Secondary | ICD-10-CM

## 2022-07-02 DIAGNOSIS — D251 Intramural leiomyoma of uterus: Secondary | ICD-10-CM

## 2022-07-02 DIAGNOSIS — Z0000 Encounter for general adult medical examination without abnormal findings: Secondary | ICD-10-CM

## 2022-07-02 DIAGNOSIS — Z79899 Other long term (current) drug therapy: Secondary | ICD-10-CM

## 2022-07-02 NOTE — Progress Notes (Signed)
COMPLETE PHYSICAL  Assessment and Plan:  Nancy Carter was seen today for annual exam   Diagnoses and all orders for this visit:  Encounter for Annual Physical Exam with abnormal findings Due annually  Health Maintenance reviewed Healthy lifestyle reviewed and goals set  Essential hypertension Discussed DASH (Dietary Approaches to Stop Hypertension) DASH diet is lower in sodium than a typical American diet. Cut back on foods that are high in saturated fat, cholesterol, and trans fats. Eat more whole-grain foods, fish, poultry, and nuts Remain active and exercise as tolerated daily.  Monitor BP at home-Call if greater than 130/80.  Check CMP/CBC   Intramural leiomyoma of uterus Continue f/u with GYN as recommended   Benign microscopic hematuria Has seen urology without further workup recommended Low risk hx Continue to monitor  UA, microalbumin  Screening cholesterol level Check and monitor lipids  Screening for diabetes mellitus/ hx of prediabetes Check and monitor A1c  Screening for thyroid disorder Check and monitor TSH  Vitamin D deficiency Continue supplement Check and monitor levels  BMI 27 Discussed appropriate BMI Goal of losing 1 lb per month. Diet modification. Physical activity. Encouraged/praised to build confidence.  Medication management All medications discussed and reviewed in full. All questions and concerns regarding medications addressed.    Screening for cardiovascular disease Monitor EKG  Orders Placed This Encounter  Procedures   CBC with Differential/Platelet   COMPLETE METABOLIC PANEL WITH GFR   Lipid panel   TSH   Hemoglobin A1c   VITAMIN D 25 Hydroxy (Vit-D Deficiency, Fractures)   Urinalysis, Routine w reflex microscopic   EKG 12-Lead    Notify office for further evaluation and treatment, questions or concerns if any reported s/s fail to improve.   The patient was advised to call back or seek an in-person evaluation if any  symptoms worsen or if the condition fails to improve as anticipated.   Further disposition pending results of labs. Discussed med's effects and SE's.    I discussed the assessment and treatment plan with the patient. The patient was provided an opportunity to ask questions and all were answered. The patient agreed with the plan and demonstrated an understanding of the instructions.  Discussed med's effects and SE's. Screening labs and tests as requested with regular follow-up as recommended.  I provided 35 minutes of face-to-face time during this encounter including counseling, chart review, and critical decision making was preformed.   Future Appointments  Date Time Provider [place]  07/20/2022  3:40 PM Skeet Latch, MD [facility]  06/07/2023  3:45 PM Megan Salon, MD [facility]  07/08/2023  3:00 PM Darrol Jump, NP [facility] None    HPI  61 y.o. Caucasian female, presents for a complete physical and follow up for has Intramural leiomyoma of uterus; Hypertension; H/O dysplastic nevus; Bradycardia; Overweight (BMI 25.0-29.9); Asymptomatic microscopic hematuria; Hyperlipidemia; Vitamin D deficiency; Other abnormal glucose (hx of prediabetes); and Postmenopausal on their problem list.   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  She has no concerns today.   She has been followed  by Dr. 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   She sees dermatology Dr. Carolynn Sayers, NP annually for history of dysplastic nevus.   She has had ongoing asymptomatic microscopic hematuria for which she saw urology and was not recommended workup.   BMI is Body mass index  is 25.91 kg/m., she is continuing to work on diet and exercise. Wt Readings from Last 3 Encounters:  07/02/22 142 lb 12.8 oz (64.8 kg)  05/26/22 143 lb  (64.9 kg)  07/02/21 149 lb (67.6 kg)   Her blood pressure has been controlled at home, today their BP is BP: 126/88 She does workout. She denies chest pain, shortness of breath, dizziness.   She is not on cholesterol medication and denies myalgias. Her cholesterol is at goal. The cholesterol last visit was:   Lab Results  Component Value Date   CHOL 190 07/02/2021   HDL 63 07/02/2021   LDLCALC 106 (H) 07/02/2021   TRIG 109 07/02/2021   CHOLHDL 3.0 07/02/2021   She has hx of prediabetes (5.7% in 05/2015) reversed by lifestyle modification; last A1C in the office was:  Lab Results  Component Value Date   HGBA1C 5.5 07/02/2021   Last GFR: Lab Results  Component Value Date   GFRNONAA 82 01/01/2021   Patient in on vitamin D supplement, taking 1000 IU daily:  Lab Results  Component Value Date   VD25OH 38 07/02/2021     Current Medications:  Current Outpatient Medications on File Prior to Visit  Medication Sig Dispense Refill   Cholecalciferol 50 MCG (2000 UT) CAPS Take 1 capsule (2,000 Units total) by mouth daily.     losartan-hydrochlorothiazide (HYZAAR) 50-12.5 MG tablet Take 1 tablet by mouth daily for blood pressure 90 tablet 1   Multiple Vitamin (MULTIVITAMIN) tablet Take 1 tablet by mouth daily.     No current facility-administered medications on file prior to visit.   Allergies:  No Known Allergies Medical History:  She has Intramural leiomyoma of uterus; Hypertension; H/O dysplastic nevus; Bradycardia; Overweight (BMI 25.0-29.9); Asymptomatic microscopic hematuria; Hyperlipidemia; Vitamin D deficiency; Other abnormal glucose (hx of prediabetes); and Postmenopausal on their problem list. Health Maintenance:   Immunization History  Administered Date(s) Administered   Influenza Inj Mdck Quad Pf 06/05/2022   Influenza-Unspecified 05/17/2016, 04/22/2018, 05/17/2018, 05/11/2019, 06/12/2021   Td 05/14/2006   Tdap 06/08/2013   Tetanus: 2014 Pneumovax: N/A Prevnar 13:  N/A Flu vaccine: 05/2022 Shingrix: Declines  Covid 19: had 2/2, 2021 pfizer -dates requested, 02/2022 had +Covid  Pap: 02/12/2021 normal with Dr. Sabra Heck MGM: 05/06/2022 Negative f/u 1 year DEXA: N/A  Colonoscopy: 08/2013 due in 10 years EGD: N/A  Last Dental Exam: [facility], last exam 2023, q35mLast Eye Exam: Dr. OSyrian [place] last 08/2021, wears glasses Last skin exam: Annually, Dr. LCarolynn SayersNP, has scheduled 07/29/2021 Due 07/2022  Patient Care Team: MUnk Pinto MD as PCP - General (Internal Medicine) Pyrtle, JLajuan Lines MD as Consulting Physician (Gastroenterology) MMegan Salon MD as Consulting Physician (Gynecology) LDruscilla Brownie MD as Referring Physician (Dermatology)  Surgical History:  She has a past surgical history that includes Dilatation & currettage/hysteroscopy with resectoscope (N/A, 04/10/2013); Laparoscopic cholecystectomy (09-09-2001  dr iDalbert Batman '@[facility]'$ ); and Laparoscopic bilateral salpingectomy (Right, 05/16/2018). Family History:  Herfamily history includes Arrhythmia in her paternal grandfather; Cerebral aneurysm (age of onset: 68 in her maternal grandmother; Diabetes in her maternal grandmother and mother; Hypertension in her brother, maternal grandfather, and mother; Obesity in her brother; Peripheral vascular disease in her brother; Stroke (age of onset: 8110 in her paternal grandmother. Social History:  She reports that she has never smoked. She has never used smokeless tobacco. She reports that she does not drink alcohol and does not use drugs.  Review of Systems: Review of Systems  Constitutional:  Negative for  chills, diaphoresis, fever, malaise/fatigue and weight loss.  HENT:  Negative for hearing loss and tinnitus.   Eyes:  Negative for blurred vision and double vision.  Respiratory:  Negative for cough, shortness of breath and wheezing.   Cardiovascular:  Negative for chest pain, palpitations, orthopnea, claudication and leg swelling.   Gastrointestinal:  Negative for abdominal pain, blood in stool, constipation, diarrhea, heartburn, melena, nausea and vomiting.  Genitourinary: Negative.  Negative for dysuria and urgency.  Musculoskeletal:  Negative for joint pain and myalgias.  Skin:  Negative for rash.  Neurological:  Negative for dizziness, tingling, sensory change, weakness and headaches.  Endo/Heme/Allergies:  Negative for polydipsia.  Psychiatric/Behavioral: Negative.  Negative for depression, memory loss and substance abuse. The patient is not nervous/anxious and does not have insomnia.   All other systems reviewed and are negative.   Physical Exam: Estimated body mass index is 25.91 kg/m as calculated from the following:   Height as of this encounter: 5' 2.25" (1.581 m).   Weight as of this encounter: 142 lb 12.8 oz (64.8 kg). BP 126/88   Pulse 82   Temp (!) 97.1 F (36.2 C)   Ht 5' 2.25" (1.581 m)   Wt 142 lb 12.8 oz (64.8 kg)   LMP 05/17/2016 (Approximate)   SpO2 99%   BMI 25.91 kg/m  General Appearance: Well nourished, in no apparent distress.  Eyes: PERRLA, EOMs, conjunctiva no swelling or erythema Sinuses: No Frontal/maxillary tenderness  ENT/Mouth: Ext aud canals clear, normal light reflex with TMs without erythema, bulging. Good dentition. No erythema, swelling, or exudate on post pharynx. Tonsils not swollen or erythematous. Hearing normal.  Neck: Supple, thyroid normal. No bruits  Respiratory: Respiratory effort normal, BS equal bilaterally without rales, rhonchi, wheezing or stridor.  Cardio: RRR without murmurs, rubs or gallops. Brisk peripheral pulses without edema.  Chest: symmetric, with normal excursions and percussion.  Breasts: Defer to GYN Abdomen: Soft, nontender, no guarding, rebound, hernias, masses, or organomegaly.  Lymphatics: Non tender without lymphadenopathy.  Genitourinary: Defer to GYN Musculoskeletal: Full ROM all peripheral extremities,5/5 strength, and normal gait.   Skin: Warm, dry without rashes, lesions, ecchymosis. Neuro: Cranial nerves intact, reflexes equal bilaterally. Normal muscle tone, no cerebellar symptoms. Sensation intact.  Psych: Awake and oriented X 3, normal affect, Insight and Judgment appropriate.   EKG: no ST changes, sinus bradycardia   Darrol Jump, NP 3:25 PM [facility]

## 2022-07-02 NOTE — Patient Instructions (Signed)

## 2022-07-03 LAB — CBC WITH DIFFERENTIAL/PLATELET
Absolute Monocytes: 414 cells/uL (ref 200–950)
Basophils Absolute: 110 cells/uL (ref 0–200)
Basophils Relative: 1.6 %
Eosinophils Absolute: 110 cells/uL (ref 15–500)
Eosinophils Relative: 1.6 %
HCT: 38.2 % (ref 35.0–45.0)
Hemoglobin: 12.7 g/dL (ref 11.7–15.5)
Lymphs Abs: 2208 cells/uL (ref 850–3900)
MCH: 30.2 pg (ref 27.0–33.0)
MCHC: 33.2 g/dL (ref 32.0–36.0)
MCV: 91 fL (ref 80.0–100.0)
MPV: 11.5 fL (ref 7.5–12.5)
Monocytes Relative: 6 %
Neutro Abs: 4057 cells/uL (ref 1500–7800)
Neutrophils Relative %: 58.8 %
Platelets: 248 10*3/uL (ref 140–400)
RBC: 4.2 10*6/uL (ref 3.80–5.10)
RDW: 11.8 % (ref 11.0–15.0)
Total Lymphocyte: 32 %
WBC: 6.9 10*3/uL (ref 3.8–10.8)

## 2022-07-03 LAB — URINALYSIS, ROUTINE W REFLEX MICROSCOPIC
Bacteria, UA: NONE SEEN /HPF
Bilirubin Urine: NEGATIVE
Glucose, UA: NEGATIVE
Hyaline Cast: NONE SEEN /LPF
Ketones, ur: NEGATIVE
Leukocytes,Ua: NEGATIVE
Nitrite: NEGATIVE
Protein, ur: NEGATIVE
RBC / HPF: NONE SEEN /HPF (ref 0–2)
Specific Gravity, Urine: 1.009 (ref 1.001–1.035)
Squamous Epithelial / HPF: NONE SEEN /HPF (ref <=5)
WBC, UA: NONE SEEN /HPF (ref 0–5)
pH: 5.5 (ref 5.0–8.0)

## 2022-07-03 LAB — COMPLETE METABOLIC PANEL WITH GFR
AG Ratio: 1.4 (calc) (ref 1.0–2.5)
ALT: 11 U/L (ref 6–29)
AST: 18 U/L (ref 10–35)
Albumin: 4.2 g/dL (ref 3.6–5.1)
Alkaline phosphatase (APISO): 86 U/L (ref 37–153)
BUN: 13 mg/dL (ref 7–25)
CO2: 29 mmol/L (ref 20–32)
Calcium: 9.6 mg/dL (ref 8.6–10.4)
Chloride: 100 mmol/L (ref 98–110)
Creat: 0.86 mg/dL (ref 0.50–1.03)
Globulin: 2.9 g/dL (calc) (ref 1.9–3.7)
Glucose, Bld: 83 mg/dL (ref 65–99)
Potassium: 3.7 mmol/L (ref 3.5–5.3)
Sodium: 138 mmol/L (ref 135–146)
Total Bilirubin: 0.4 mg/dL (ref 0.2–1.2)
Total Protein: 7.1 g/dL (ref 6.1–8.1)
eGFR: 78 mL/min/{1.73_m2} (ref >=60)

## 2022-07-03 LAB — TSH: TSH: 1.46 mIU/L (ref 0.40–4.50)

## 2022-07-03 LAB — LIPID PANEL
Cholesterol: 177 mg/dL (ref <200)
HDL: 61 mg/dL (ref >=50)
LDL Cholesterol (Calc): 95 mg/dL (calc)
Non-HDL Cholesterol (Calc): 116 mg/dL (calc) (ref <130)
Total CHOL/HDL Ratio: 2.9 (calc) (ref <5.0)
Triglycerides: 115 mg/dL (ref <150)

## 2022-07-03 LAB — HEMOGLOBIN A1C
Hgb A1c MFr Bld: 5.7 % of total Hgb — ABNORMAL HIGH (ref <5.7)
Mean Plasma Glucose: 117 mg/dL
eAG (mmol/L): 6.5 mmol/L

## 2022-07-03 LAB — VITAMIN D 25 HYDROXY (VIT D DEFICIENCY, FRACTURES): Vit D, 25-Hydroxy: 18 ng/mL — ABNORMAL LOW (ref 30–100)

## 2022-07-03 LAB — MICROSCOPIC MESSAGE

## 2022-07-20 ENCOUNTER — Other Ambulatory Visit (HOSPITAL_COMMUNITY): Payer: Self-pay

## 2022-07-20 ENCOUNTER — Ambulatory Visit (INDEPENDENT_AMBULATORY_CARE_PROVIDER_SITE_OTHER): Payer: No Typology Code available for payment source | Admitting: Cardiovascular Disease

## 2022-07-20 ENCOUNTER — Encounter (HOSPITAL_BASED_OUTPATIENT_CLINIC_OR_DEPARTMENT_OTHER): Payer: Self-pay | Admitting: Cardiovascular Disease

## 2022-07-20 VITALS — BP 132/82 | HR 56 | Ht 62.25 in | Wt 143.7 lb

## 2022-07-20 DIAGNOSIS — Z5181 Encounter for therapeutic drug level monitoring: Secondary | ICD-10-CM | POA: Diagnosis not present

## 2022-07-20 DIAGNOSIS — R001 Bradycardia, unspecified: Secondary | ICD-10-CM | POA: Diagnosis not present

## 2022-07-20 DIAGNOSIS — I10 Essential (primary) hypertension: Secondary | ICD-10-CM

## 2022-07-20 MED ORDER — LOSARTAN POTASSIUM-HCTZ 100-12.5 MG PO TABS
1.0000 | ORAL_TABLET | Freq: Every day | ORAL | 1 refills | Status: DC
  Filled 2022-07-20: qty 90, 90d supply, fill #0
  Filled 2022-10-18: qty 90, 90d supply, fill #1

## 2022-07-20 NOTE — Patient Instructions (Signed)
Medication Instructions:  INCREASE YOUR LOSARTAN TO 100-12.5 MG DAILY   *If you need a refill on your cardiac medications before your next appointment, please call your pharmacy*  Lab Work: BMET IN 1 WEEK   If you have labs (blood work) drawn today and your tests are completely normal, you will receive your results only by: [place] (if you have MyChart) OR A paper copy in the mail If you have any lab test that is abnormal or we need to change your treatment, we will call you to review the results.  Testing/Procedures: NONE  Follow-Up: At [facility], you and your health needs are our priority.  As part of our continuing mission to provide you with exceptional heart care, we have created designated Provider Care Teams.  These Care Teams include your primary Cardiologist (physician) and Advanced Practice Providers (APPs -  Physician Assistants and Nurse Practitioners) who all work together to provide you with the care you need, when you need it.  We recommend signing up for the patient portal called "MyChart".  Sign up information is provided on this After Visit Summary.  MyChart is used to connect with patients for Virtual Visits (Telemedicine).  Patients are able to view lab/test results, encounter notes, upcoming appointments, etc.  Non-urgent messages can be sent to your provider as well.   To learn more about what you can do with MyChart, go to [email or web address].    Your next appointment:   2 month(s)  The format for your next appointment:   In Person  Provider:   Overton Mam NP  Other Instructions MONITOR AND LOG BLOOD PRESSURE DAILY. BRING READINGS AND MACHINE TO FOLLOW UP   Important Information About Sugar

## 2022-07-20 NOTE — Progress Notes (Signed)
Cardiology Office Note:    Date:  07/20/2022   ID:  Nancy Carter, DOB 1961-06-25, MRN [id number]  PCP:  Unk Pinto, MD   [place] [facility] Cardiologist:  Skeet Latch, MD     Referring MD: Megan Salon, MD   No chief complaint on file.   History of Present Illness:    Nancy Carter is a 61 y.o. female hypertension, hyperlipidemia, and bradycardia who is being seen today for the evaluation of hypertension at the request of Megan Salon, MD.  She saw Dr. Sabra Heck 06/05/22 and was noted to be bradycardic and was referred to cardiology.  BP at that time was 130/80.  She saw her PCP 07/06/22 and her heart rate was 82 bpm.   She has been doing well overall. Her blood pressures at home usually averages ~017P systolic and 10C~ diastolic. She denies any LE edema or pain on her legs when she walks. Her mother and her brother has a history of hypertension. Her paternal grandmother had a heart attack. She usually cooks at home and avoids buying food from outside. On average, she will have a gallon of sweet tea with splenda a day. She denies any alcohol or tobacco usage. he is currently working as a Marine scientist in a office setting.   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   S/p oopherectomy 04/2018   Ovarian neoplasm    right side   Wears glasses     Past Surgical History:  Procedure Laterality Date   DILATATION & CURRETTAGE/HYSTEROSCOPY WITH RESECTOCOPE N/A 04/10/2013   Procedure: DILATATION & CURETTAGE/HYSTEROSCOPY WITH RESECTOCOPE;  Surgeon: Lyman Speller, MD;  Location: [facility];  Service: Gynecology;  Laterality: N/A;   LAPAROSCOPIC BILATERAL SALPINGECTOMY Right 05/16/2018   Procedure: WITH RIGHT OOPHORECTOMY;  Surgeon: Megan Salon, MD;  [facility];  Service: Gynecology;  Laterality: Right;  possible RSO   LAPAROSCOPIC CHOLECYSTECTOMY  09-09-2001  dr Dalbert Batman  '@[facility]'$      Current Medications: Current Meds  Medication Sig   Cholecalciferol 50 MCG (2000 UT) CAPS Take 1 capsule (2,000 Units total) by mouth daily.   losartan-hydrochlorothiazide (HYZAAR) 100-12.5 MG tablet Take 1 tablet by mouth daily.   Multiple Vitamin (MULTIVITAMIN) tablet Take 1 tablet by mouth daily.   [DISCONTINUED] losartan-hydrochlorothiazide (HYZAAR) 50-12.5 MG tablet Take 1 tablet by mouth daily for blood pressure     Allergies:   Patient has no known allergies.   Social History   Socioeconomic History   Marital status: Married    Spouse name: Not on file   Number of children: Not on file   Years of education: Not on file   Highest education level: Not on file  Occupational History   Occupation: Nursing  Tobacco Use   Smoking status: Never   Smokeless tobacco: Never  Vaping Use   Vaping Use: Never used  Substance and Sexual Activity   Alcohol use: No   Drug use: Never   Sexual activity: Not Currently    Partners: Male    Birth control/protection: Post-menopausal  Other Topics Concern   Not on file  Social History Narrative   Not on file   Social Determinants of Health   Financial Resource Strain: Not on file  Food Insecurity: Not on file  Transportation Needs: Not on file  Physical Activity: Unknown (07/03/2019)   Exercise Vital Sign    Days of Exercise per Week:  3 days    Minutes of Exercise per Session: Not on file  Stress: No Stress Concern Present (07/03/2019)   [place]    Feeling of Stress : Only a little  Social Connections: Not on file     Family History: The patient's family history includes Arrhythmia in her paternal grandfather; Cerebral aneurysm (age of onset: 36) in her maternal grandmother; Diabetes in her maternal grandmother and mother; Hypertension in her brother, maternal grandfather, and mother; Obesity in her brother; Peripheral vascular disease in her brother; Stroke  (age of onset: 60) in her paternal grandmother. There is no history of Colon cancer, Pancreatic cancer, Stomach cancer, or Breast cancer.  ROS:   Please see the history of present illness.     All other systems reviewed and are negative.  EKGs/Labs/Other Studies Reviewed:    The following studies were reviewed today:   EKG:  EKG is personally reviewed. 07/20/22: EKG was not ordered.. 07/02/22: Sinus bradycardia. Rate 56 bpm.  Recent Labs: 07/02/2022: ALT 11; BUN 13; Creat 0.86; Hemoglobin 12.7; Platelets 248; Potassium 3.7; Sodium 138; TSH 1.46  Recent Lipid Panel    Component Value Date/Time   CHOL 177 07/02/2022 1544   TRIG 115 07/02/2022 1544   HDL 61 07/02/2022 1544   CHOLHDL 2.9 07/02/2022 1544   VLDL 14 06/11/2016 0918   LDLCALC 95 07/02/2022 1544     Risk Assessment/Calculations:                Physical Exam:    VS:  BP 132/82 (BP Location: Right Arm, Patient Position: Sitting, Cuff Size: Normal)   Pulse (!) 56   Ht 5' 2.25" (1.581 m)   Wt 143 lb 11.2 oz (65.2 kg)   LMP 05/17/2016 (Approximate)   SpO2 99%   BMI 26.07 kg/m  , BMI Body mass index is 26.07 kg/m. GENERAL:  Well appearing HEENT: Pupils equal round and reactive, fundi not visualized, oral mucosa unremarkable NECK:  No jugular venous distention, waveform within normal limits, carotid upstroke brisk and symmetric, no bruits, no thyromegaly LUNGS:  Clear to auscultation bilaterally HEART:  RRR.  PMI not displaced or sustained,S1 and S2 within normal limits, no S3, no S4, no clicks, no rubs, no murmurs ABD:  Flat, positive bowel sounds normal in frequency in pitch, no bruits, no rebound, no guarding, no midline pulsatile mass, no hepatomegaly, no splenomegaly EXT:  2 plus pulses throughout, no edema, no cyanosis no clubbing SKIN:  No rashes no nodules NEURO:  Cranial nerves II through XII grossly intact, motor grossly intact throughout PSYCH:  Cognitively intact, oriented to person place and  time   ASSESSMENT:    1. Primary hypertension   2. Essential hypertension   3. Bradycardia   4. Therapeutic drug monitoring    PLAN:    Hypertension Blood pressure has been slightly above her goal of less than 130/80.  She is bradycardic but has not had any symptoms.  We discussed lifestyle interventions such as limiting her sodium intake and increasing her exercise to least 150 minutes weekly.  Her goal is less than '2300mg'$  of sodium a day.  She has been drinking a gallon of tea daily and will cut back on this as well.  We will increase her losartan/HCTZ to 100/12.5 mg daily.  Check a BMP in 1 week.  She will check her blood pressures at home bring to follow-up.      Bradycardia She is asymptomatic.  Avoid nodal agents.  No indication for pacing.         Disposition:   Medication Adjustments/Labs and Tests Ordered: Current medicines are reviewed at length with the patient today.  Concerns regarding medicines are outlined above.  Orders Placed This Encounter  Procedures   Basic metabolic panel   Meds ordered this encounter  Medications   losartan-hydrochlorothiazide (HYZAAR) 100-12.5 MG tablet    Sig: Take 1 tablet by mouth daily.    Dispense:  90 tablet    Refill:  1    D/C 50-12.5 MG RX    Patient Instructions  Medication Instructions:  INCREASE YOUR LOSARTAN TO 100-12.5 MG DAILY   *If you need a refill on your cardiac medications before your next appointment, please call your pharmacy*  Lab Work: BMET IN 1 WEEK   If you have labs (blood work) drawn today and your tests are completely normal, you will receive your results only by: [place] (if you have MyChart) OR A paper copy in the mail If you have any lab test that is abnormal or we need to change your treatment, we will call you to review the results.  Testing/Procedures: NONE  Follow-Up: At [facility], you and your health needs are our priority.  As part of our continuing mission to  provide you with exceptional heart care, we have created designated Provider Care Teams.  These Care Teams include your primary Cardiologist (physician) and Advanced Practice Providers (APPs -  Physician Assistants and Nurse Practitioners) who all work together to provide you with the care you need, when you need it.  We recommend signing up for the patient portal called "MyChart".  Sign up information is provided on this After Visit Summary.  MyChart is used to connect with patients for Virtual Visits (Telemedicine).  Patients are able to view lab/test results, encounter notes, upcoming appointments, etc.  Non-urgent messages can be sent to your provider as well.   To learn more about what you can do with MyChart, go to [email or web address].    Your next appointment:   2 month(s)  The format for your next appointment:   In Person  Provider:   Overton Mam NP  Other Instructions MONITOR AND LOG BLOOD PRESSURE DAILY. BRING READINGS AND MACHINE TO FOLLOW UP   Important Information About [place]         I,Danny Valdes,acting as a scribe for Skeet Latch, MD.,have documented all relevant documentation on the behalf of Skeet Latch, MD,as directed by  Skeet Latch, MD while in the presence of Skeet Latch, MD.  I, [place] Linsey, MD have reviewed all documentation for this visit.  The documentation of the exam, diagnosis, procedures, and orders on 07/20/2022 are all accurate and complete.   Signed, Skeet Latch, MD  07/20/2022 3:59 PM    [place]

## 2022-07-20 NOTE — Assessment & Plan Note (Signed)
Blood pressure has been slightly above her goal of less than 130/80.  She is bradycardic but has not had any symptoms.  We discussed lifestyle interventions such as limiting her sodium intake and increasing her exercise to least 150 minutes weekly.  Her goal is less than '2300mg'$  of sodium a day.  She has been drinking a gallon of tea daily and will cut back on this as well.  We will increase her losartan/HCTZ to 100/12.5 mg daily.  Check a BMP in 1 week.  She will check her blood pressures at home bring to follow-up.

## 2022-07-20 NOTE — Assessment & Plan Note (Signed)
She is asymptomatic.  Avoid nodal agents.  No indication for pacing.

## 2022-07-21 ENCOUNTER — Other Ambulatory Visit (HOSPITAL_COMMUNITY): Payer: Self-pay

## 2022-07-28 LAB — BASIC METABOLIC PANEL
BUN/Creatinine Ratio: 13 (ref 9–23)
BUN: 11 mg/dL (ref 6–24)
CO2: 26 mmol/L (ref 20–29)
Calcium: 9.5 mg/dL (ref 8.7–10.2)
Chloride: 99 mmol/L (ref 96–106)
Creatinine, Ser: 0.88 mg/dL (ref 0.57–1.00)
Glucose: 99 mg/dL (ref 70–99)
Potassium: 4 mmol/L (ref 3.5–5.2)
Sodium: 139 mmol/L (ref 134–144)
eGFR: 76 mL/min/{1.73_m2} (ref >59)

## 2022-09-03 DIAGNOSIS — Z872 Personal history of diseases of the skin and subcutaneous tissue: Secondary | ICD-10-CM | POA: Diagnosis not present

## 2022-09-03 DIAGNOSIS — D225 Melanocytic nevi of trunk: Secondary | ICD-10-CM | POA: Diagnosis not present

## 2022-09-03 DIAGNOSIS — L821 Other seborrheic keratosis: Secondary | ICD-10-CM | POA: Diagnosis not present

## 2022-09-03 DIAGNOSIS — L814 Other melanin hyperpigmentation: Secondary | ICD-10-CM | POA: Diagnosis not present

## 2022-09-20 NOTE — Progress Notes (Signed)
Cardiology Office Note:    Date:  09/21/2022   ID:  Nancy Carter, DOB [birth year], MRN [id number]  PCP:  Unk Pinto, MD   [place] Providers Cardiologist:  Skeet Latch, MD     Referring MD: Unk Pinto, MD   No chief complaint on file.   History of Present Illness:    Nancy Carter is a 62 y.o. female with a hx of hypertension, hyperlipidemia, bradycardia.  Initially seen in our office by Dr. Oval Linsey on 07/20/2022, at the behest of her gynecologist Dr. Sabra Heck -she had a recent appointment and was noted to be bradycardic, she was then referred to cardiology.  Her blood pressure was suboptimal and her losartan/HCTZ was increased to 100/12.5 daily.  She was instructed to keep a blood pressure log and bring at her next appointment. Repeat BMET after titration was stable.   She presents today for a follow up of her BP. 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 She has been doing well, tolerated the adjustment of her antihypertensive well. She presents her BP log, she is checking it twice/day, am BP readings are 98-114/70's, evening BP readings are 112-126/50-60's. She denies dizziness, presyncope, or syncope. She denies chest pain, palpitations, dyspnea, pnd, orthopnea, n, v, syncope, edema, weight gain, or early satiety.   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   S/p oopherectomy 04/2018   Ovarian neoplasm    right side   Wears glasses     Past Surgical History:  Procedure Laterality Date   DILATATION & CURRETTAGE/HYSTEROSCOPY WITH RESECTOCOPE N/A 04/10/2013   Procedure: DILATATION & CURETTAGE/HYSTEROSCOPY WITH RESECTOCOPE;  Surgeon: Lyman Speller, MD;  Location: [facility];  Service: Gynecology;  Laterality: N/A;   LAPAROSCOPIC BILATERAL SALPINGECTOMY Right 05/16/2018   Procedure: WITH RIGHT OOPHORECTOMY;  Surgeon: Megan Salon, MD;  Location: [facility];  Service:  Gynecology;  Laterality: Right;  possible RSO   LAPAROSCOPIC CHOLECYSTECTOMY  09-09-2001  dr Dalbert Batman  '@[email or web address]'$     Current Medications: Current Meds  Medication Sig   Cholecalciferol 50 MCG (2000 UT) CAPS Take 1 capsule (2,000 Units total) by mouth daily.   losartan-hydrochlorothiazide (HYZAAR) 100-12.5 MG tablet Take 1 tablet by mouth daily.     Allergies:   Patient has no known allergies.   Social History   Socioeconomic History   Marital status: Married    Spouse name: Not on file   Number of children: Not on file   Years of education: Not on file   Highest education level: Not on file  Occupational History   Occupation: Nursing  Tobacco Use   Smoking status: Never   Smokeless tobacco: Never  Vaping Use   Vaping Use: Never used  Substance and Sexual Activity   Alcohol use: No   Drug use: Never   Sexual activity: Not Currently    Partners: Male    Birth control/protection: Post-menopausal  Other Topics Concern   Not on file  Social History Narrative   Not on file   Social Determinants of Health   Financial Resource Strain: Not on file  Food Insecurity: Not on file  Transportation Needs: Not on file  Physical Activity: Unknown (07/03/2019)   Exercise Vital Sign    Days of Exercise per Week: 3 days    Minutes of Exercise per Session: Not on file  Stress: No Stress Concern Present (07/03/2019)   [employer] - Occupational Stress Questionnaire    Feeling of Stress : Only a little  Social Connections: Not on file     Family History: The patient's family history includes Arrhythmia in her paternal grandfather; Cerebral aneurysm (age of onset: 44) in her maternal grandmother; Diabetes in her maternal grandmother and mother; Hypertension in her brother, maternal grandfather, and mother; Obesity in her brother; Peripheral vascular disease in her brother; Stroke (age of onset: 87) in her paternal grandmother. There is no history of Colon cancer,  Pancreatic cancer, Stomach cancer, or Breast cancer.  ROS:   Please see the history of present illness.    All other systems reviewed and are negative.  EKGs/Labs/Other Studies Reviewed:    The following studies were reviewed today:   EKG:  EKG is not ordered today.    Recent Labs: 07/02/2022: ALT 11; Hemoglobin 12.7; Platelets 248; TSH 1.46 07/27/2022: BUN 11; Creatinine, Ser 0.88; Potassium 4.0; Sodium 139  Recent Lipid Panel    Component Value Date/Time   CHOL 177 07/02/2022 1544   TRIG 115 07/02/2022 1544   HDL 61 07/02/2022 1544   CHOLHDL 2.9 07/02/2022 1544   VLDL 14 06/11/2016 0918   LDLCALC 95 07/02/2022 1544     Risk Assessment/Calculations:                Physical Exam:    VS:  BP 128/86   Pulse 66   Ht 5' 2.25" (1.581 m)   Wt 145 lb (65.8 kg)   LMP 05/17/2016 (Approximate)   BMI 26.31 kg/m     Wt Readings from Last 3 Encounters:  09/21/22 145 lb (65.8 kg)  07/20/22 143 lb 11.2 oz (65.2 kg)  07/02/22 142 lb 12.8 oz (64.8 kg)     GEN:  Well nourished, well developed in no acute distress HEENT: Normal NECK: No JVD; No carotid bruits LYMPHATICS: No lymphadenopathy CARDIAC: RRR, no murmurs, rubs, gallops RESPIRATORY:  Clear to auscultation without rales, wheezing or rhonchi  ABDOMEN: Soft, non-tender, non-distended MUSCULOSKELETAL:  No edema; No deformity  SKIN: Warm and dry NEUROLOGIC:  Alert and oriented x 3 PSYCHIATRIC:  Normal affect   ASSESSMENT:    1. Primary hypertension   2. Bradycardia    PLAN:    In order of problems listed above:  Hypertension - Checking BP twice daily, am BP readings are 98-114/70's, evening BP readings are 112-126/50-60's. Continue losartan/HCTZ 100/12.5 md daily. Bradycardia - HR today is 66 bpm, states normally 40's-50's, asymptomatic.        Disposition - return in 8-12 months with Dr. Oval Linsey.    Medication Adjustments/Labs and Tests Ordered: Current medicines are reviewed at length with the  patient today.  Concerns regarding medicines are outlined above.  No orders of the defined types were placed in this encounter.  No orders of the defined types were placed in this encounter.   Patient Instructions  Medication Instructions:  Your Physician recommend you continue on your current medication as directed.    *If you need a refill on your cardiac medications before your next appointment, please call your pharmacy*  Follow-Up: At [facility], you and your health needs are our priority.  As part of our continuing mission to provide you with exceptional heart care, we have created designated Provider Care Teams.  These Care Teams include your primary Cardiologist (physician) and Advanced Practice Providers (APPs -  Physician Assistants and Nurse Practitioners) who all work together to provide you with the care you need, when  you need it.  We recommend signing up for the patient portal called "MyChart".  Sign up information is provided on this After Visit Summary.  MyChart is used to connect with patients for Virtual Visits (Telemedicine).  Patients are able to view lab/test results, encounter notes, upcoming appointments, etc.  Non-urgent messages can be sent to your provider as well.   To learn more about what you can do with MyChart, go to [email or web address].    Your next appointment:   8-12 month(s)  Provider:   Skeet Latch, MD    Other Instructions Heart Healthy Diet Recommendations: A low-salt diet is recommended. Meats should be grilled, baked, or boiled. Avoid fried foods. Focus on lean protein sources like fish or chicken with vegetables and fruits. The American Heart Association is a Microbiologist!  American Heart Association Diet and Lifeystyle Recommendations   Exercise recommendations: The American Heart Association recommends 150 minutes of moderate intensity exercise weekly. Try 30 minutes of moderate intensity exercise 4-5 times per  week. This could include walking, jogging, or swimming.     Signed, Trudi Ida, NP  09/21/2022 3:49 PM    [place]

## 2022-09-21 ENCOUNTER — Ambulatory Visit (HOSPITAL_BASED_OUTPATIENT_CLINIC_OR_DEPARTMENT_OTHER): Payer: 59 | Admitting: Cardiology

## 2022-09-21 ENCOUNTER — Encounter (HOSPITAL_BASED_OUTPATIENT_CLINIC_OR_DEPARTMENT_OTHER): Payer: Self-pay | Admitting: Cardiology

## 2022-09-21 VITALS — BP 128/86 | HR 66 | Ht 62.25 in | Wt 145.0 lb

## 2022-09-21 DIAGNOSIS — R001 Bradycardia, unspecified: Secondary | ICD-10-CM

## 2022-09-21 DIAGNOSIS — I10 Essential (primary) hypertension: Secondary | ICD-10-CM

## 2022-09-21 NOTE — Patient Instructions (Signed)
Medication Instructions:  Your Physician recommend you continue on your current medication as directed.    *If you need a refill on your cardiac medications before your next appointment, please call your pharmacy*  Follow-Up: At [facility], you and your health needs are our priority.  As part of our continuing mission to provide you with exceptional heart care, we have created designated Provider Care Teams.  These Care Teams include your primary Cardiologist (physician) and Advanced Practice Providers (APPs -  Physician Assistants and Nurse Practitioners) who all work together to provide you with the care you need, when you need it.  We recommend signing up for the patient portal called "MyChart".  Sign up information is provided on this After Visit Summary.  MyChart is used to connect with patients for Virtual Visits (Telemedicine).  Patients are able to view lab/test results, encounter notes, upcoming appointments, etc.  Non-urgent messages can be sent to your provider as well.   To learn more about what you can do with MyChart, go to [email or web address].    Your next appointment:   8-12 month(s)  Provider:   Skeet Latch, MD    Other Instructions Heart Healthy Diet Recommendations: A low-salt diet is recommended. Meats should be grilled, baked, or boiled. Avoid fried foods. Focus on lean protein sources like fish or chicken with vegetables and fruits. The American Heart Association is a Microbiologist!  American Heart Association Diet and Lifeystyle Recommendations   Exercise recommendations: The American Heart Association recommends 150 minutes of moderate intensity exercise weekly. Try 30 minutes of moderate intensity exercise 4-5 times per week. This could include walking, jogging, or swimming.

## 2022-10-12 DIAGNOSIS — H47322 Drusen of optic disc, left eye: Secondary | ICD-10-CM | POA: Diagnosis not present

## 2023-01-12 ENCOUNTER — Other Ambulatory Visit (HOSPITAL_BASED_OUTPATIENT_CLINIC_OR_DEPARTMENT_OTHER): Payer: Self-pay | Admitting: Cardiovascular Disease

## 2023-01-13 ENCOUNTER — Other Ambulatory Visit (HOSPITAL_COMMUNITY): Payer: Self-pay

## 2023-01-13 MED ORDER — LOSARTAN POTASSIUM-HCTZ 100-12.5 MG PO TABS
1.0000 | ORAL_TABLET | Freq: Every day | ORAL | 1 refills | Status: DC
  Filled 2023-01-13: qty 90, 90d supply, fill #0
  Filled 2023-05-02: qty 90, 90d supply, fill #1

## 2023-01-13 NOTE — Telephone Encounter (Signed)
Rx request sent to pharmacy.  

## 2023-02-26 ENCOUNTER — Ambulatory Visit (HOSPITAL_BASED_OUTPATIENT_CLINIC_OR_DEPARTMENT_OTHER): Payer: 59 | Admitting: Family

## 2023-02-26 ENCOUNTER — Encounter (HOSPITAL_BASED_OUTPATIENT_CLINIC_OR_DEPARTMENT_OTHER): Payer: Self-pay | Admitting: Family

## 2023-02-26 VITALS — BP 122/80 | HR 51 | Ht 62.25 in | Wt 142.0 lb

## 2023-02-26 DIAGNOSIS — R001 Bradycardia, unspecified: Secondary | ICD-10-CM | POA: Diagnosis not present

## 2023-02-26 DIAGNOSIS — I10 Essential (primary) hypertension: Secondary | ICD-10-CM | POA: Diagnosis not present

## 2023-02-26 NOTE — Patient Instructions (Addendum)
Medication Instructions:  Your physician recommends that you continue on your current medications as directed. Please refer to the Current Medication list given to you today.  *If you need a refill on your cardiac medications before your next appointment, please call your pharmacy*  Follow-Up: At [facility], you and your health needs are our priority.  As part of our continuing mission to provide you with exceptional heart care, we have created designated Provider Care Teams.  These Care Teams include your primary Cardiologist (physician) and Advanced Practice Providers (APPs -  Physician Assistants and Nurse Practitioners) who all work together to provide you with the care you need, when you need it.  We recommend signing up for the patient portal called "MyChart".  Sign up information is provided on this After Visit Summary.  MyChart is used to connect with patients for Virtual Visits (Telemedicine).  Patients are able to view lab/test results, encounter notes, upcoming appointments, etc.  Non-urgent messages can be sent to your provider as well.   To learn more about what you can do with MyChart, go to [email or web address].    Your next appointment:   1 year with Dr. Duke Salvia

## 2023-02-26 NOTE — Progress Notes (Signed)
  Cardiology Office Note:  .   Date:  02/26/2023  ID:  Nancy Carter, DOB 02/06/1961, MRN [id number] PCP: Lucky Cowboy, MD  [facility] Providers Cardiologist:  Chilton Si, MD    History of Present Illness: .   Nancy Carter is a 62 y.o. female with history of hypertension, hyperlipidemia, bradycardia.  Established cardiologist will/2023 due to bradycardia.  Due to suboptimal BP control losartan/HCT was increased to 100-12.5 mg daily.  As she was not on AV nodal blocking agent and was asymptomatic in regards to bradycardia no further intervention was recommended.She was last seen 09/21/2022 doing well from a cardiac perspective and no changes made at that time.  Presents today for follow-up independently.  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 Doing pilates classes as well as walking for exercise.  Endorses eating predominantly at home and following a low-sodium, heart healthy diet.  BP at home most often 110s over 70s. Reports no shortness of breath nor dyspnea on exertion. Reports no chest pain, pressure, or tightness. No orthopnea, PND.  Mild LE edema which improves with compression stockings.  Reports no palpitations.  No lightheadedness, dizziness, near-syncope, syncope.  ROS: Please see the history of present illness.    All other systems reviewed and are negative.   Studies Reviewed: .            Risk Assessment/Calculations:             Physical Exam:   VS:  BP 122/80   Pulse (!) 51   Ht 5' 2.25" (1.581 m)   Wt 142 lb (64.4 kg)   LMP 05/17/2016 (Approximate)   BMI 25.76 kg/m    Wt Readings from Last 3 Encounters:  02/26/23 142 lb (64.4 kg)  09/21/22 145 lb (65.8 kg)  07/20/22 143 lb 11.2 oz (65.2 kg)    GEN: Well nourished, well developed in no acute distress NECK: No JVD; No carotid bruits CARDIAC: RRR, no murmurs, rubs, gallops RESPIRATORY:  Clear to auscultation without rales, wheezing or rhonchi  ABDOMEN: Soft, non-tender,  non-distended EXTREMITIES:  No edema; No deformity   ASSESSMENT AND PLAN: .    HTN - BP well controlled. Continue current antihypertensive regimen losartan-hydrochlorothiazide 100-12.5 mg daily.  She has occasional hypotensive readings at home with BP 110s/60s but as patient asymptomatic with no lightheadedness, dizziness continue current medications.  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  She is not on AV nodal blocking agent.  Asymptomatic with no lightheadedness, dizziness.  No indication for further intervention at this time.       Dispo: Follow-up in 1 year with Dr. Duke Salvia or APP.  Signed, Alver Sorrow, NP

## 2023-04-16 ENCOUNTER — Other Ambulatory Visit: Payer: Self-pay | Admitting: Obstetrics & Gynecology

## 2023-04-16 DIAGNOSIS — Z1231 Encounter for screening mammogram for malignant neoplasm of breast: Secondary | ICD-10-CM

## 2023-05-11 ENCOUNTER — Ambulatory Visit
Admission: RE | Admit: 2023-05-11 | Discharge: 2023-05-11 | Disposition: A | Discharge status: Home / Self Care | Payer: 59 | Source: Ambulatory Visit

## 2023-05-11 DIAGNOSIS — Z1231 Encounter for screening mammogram for malignant neoplasm of breast: Secondary | ICD-10-CM

## 2023-06-07 ENCOUNTER — Encounter (HOSPITAL_BASED_OUTPATIENT_CLINIC_OR_DEPARTMENT_OTHER): Payer: Self-pay | Admitting: Obstetrics & Gynecology

## 2023-06-07 ENCOUNTER — Ambulatory Visit (HOSPITAL_BASED_OUTPATIENT_CLINIC_OR_DEPARTMENT_OTHER): Payer: 59 | Admitting: Obstetrics & Gynecology

## 2023-06-07 VITALS — BP 148/90 | HR 50 | Ht 61.5 in | Wt 136.0 lb

## 2023-06-07 DIAGNOSIS — Z9079 Acquired absence of other genital organ(s): Secondary | ICD-10-CM

## 2023-06-07 DIAGNOSIS — Z90721 Acquired absence of ovaries, unilateral: Secondary | ICD-10-CM

## 2023-06-07 DIAGNOSIS — D251 Intramural leiomyoma of uterus: Secondary | ICD-10-CM

## 2023-06-07 DIAGNOSIS — Z01419 Encounter for gynecological examination (general) (routine) without abnormal findings: Secondary | ICD-10-CM

## 2023-06-07 DIAGNOSIS — Z780 Asymptomatic menopausal state: Secondary | ICD-10-CM

## 2023-06-07 NOTE — Progress Notes (Signed)
62 y.o. G0P0000 Married White or Caucasian female here for annual exam.  Doing well.  Denies vaginal bleeding.    Patient's last menstrual period was 05/17/2016 (approximate).          Sexually active: No.  The current method of family planning is post menopausal status.    Exercising: Yes.    Barre weekly, pilates Smoker:  no  Health Maintenance: Pap:  01/2021 History of abnormal Pap:  hx of one abnormal with normal follow up MMG:  04/2023 Colonoscopy:  08/22/2023 BMD:   2021 Screening Labs: done 06/2023   reports that she has never smoked. She has never used smokeless tobacco. She reports that she does not drink alcohol and does not use drugs.  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   S/p oopherectomy 04/2018   Ovarian neoplasm    right side   Wears glasses     Past Surgical History:  Procedure Laterality Date   DILATATION & CURRETTAGE/HYSTEROSCOPY WITH RESECTOCOPE N/A 04/10/2013   Procedure: DILATATION & CURETTAGE/HYSTEROSCOPY WITH RESECTOCOPE;  Surgeon: Annamaria Boots, MD;  Location: [facility];  Service: Gynecology;  Laterality: N/A;   LAPAROSCOPIC BILATERAL SALPINGECTOMY Right 05/16/2018   Procedure: WITH RIGHT OOPHORECTOMY;  Surgeon: Jerene Bears, MD;  Location: [facility];  Service: Gynecology;  Laterality: Right;  possible RSO   LAPAROSCOPIC CHOLECYSTECTOMY  09-09-2001  dr Derrell Lolling  @[facility]     Current Outpatient Medications  Medication Sig Dispense Refill   Cholecalciferol 50 MCG (2000 UT) CAPS Take 1 capsule (2,000 Units total) by mouth daily.     losartan-hydrochlorothiazide (HYZAAR) 100-12.5 MG tablet Take 1 tablet by mouth daily. 90 tablet 1   No current facility-administered medications for this visit.    Family History  Problem Relation Age of Onset   Diabetes Maternal Grandmother    Cerebral aneurysm Maternal Grandmother 32   Hypertension Maternal Grandfather    Arrhythmia Paternal  Grandfather    Diabetes Mother    Hypertension Mother    Hypertension Brother    Peripheral vascular disease Brother    Obesity Brother    Stroke Paternal Grandmother 75   Colon cancer Neg Hx    Pancreatic cancer Neg Hx    Stomach cancer Neg Hx    Breast cancer Neg Hx     ROS: Constitutional: negative Genitourinary:negative  Exam:   BP (!) 148/90 (BP Location: Right Arm, Patient Position: Sitting, Cuff Size: Large)   Pulse (!) 50   Ht 5' 1.5" (1.562 m)   Wt 136 lb (61.7 kg)   LMP 05/17/2016 (Approximate)   BMI 25.28 kg/m   Height: 5' 1.5" (156.2 cm)  General appearance: alert, cooperative and appears stated age Head: Normocephalic, without obvious abnormality, atraumatic Neck: no adenopathy, supple, symmetrical, trachea midline and thyroid normal to inspection and palpation Lungs: clear to auscultation bilaterally Breasts: normal appearance, no masses or tenderness Heart: regular rate and rhythm Abdomen: soft, non-tender; bowel sounds normal; no masses,  no organomegaly Extremities: extremities normal, atraumatic, no cyanosis or edema Skin: Skin color, texture, turgor normal. No rashes or lesions Lymph nodes: Cervical, supraclavicular, and axillary nodes normal. No abnormal inguinal nodes palpated Neurologic: Grossly normal   Pelvic: External genitalia:  no lesions              Urethra:  normal appearing urethra with no masses, tenderness or lesions  Bartholins and Skenes: normal                 Vagina: normal appearing vagina with normal color and no discharge, no lesions              Cervix: no lesions              Pap taken: No. Bimanual Exam:  Uterus:  normal size, contour, position, consistency, mobility, non-tender              Adnexa: normal adnexa and no mass, fullness, tenderness               Rectovaginal: Confirms               Anus:  normal sphincter tone, no lesions  Chaperone not available for procedure  Assessment/Plan: 1. Well woman  exam with routine gynecological exam - Pap smear neg with neg HR HPV 2022.  Plan to repeat next year - Mammogram 04/2023 - Colonoscopy 08/22/2023.  Pt aware due next year. - Bone mineral density 2021.  Plan to repeat in 1-2 years - lab work done 06/2022 - vaccines reviewed/updated  History of abnormal Pap:  hx of one abnormal with normal follow up MMG:  04/2023 Colonoscopy:  08/22/2023 BMD:   2021 Screening Labs: done 06/2023  2. Postmenopausal - not on HRT  3. Intramural leiomyoma of uterus - stable exam  4. History of right salpingo-oophorectomy

## 2023-07-08 ENCOUNTER — Encounter: Payer: Self-pay | Admitting: Nurse Practitioner

## 2023-07-08 ENCOUNTER — Ambulatory Visit (INDEPENDENT_AMBULATORY_CARE_PROVIDER_SITE_OTHER): Payer: 59 | Admitting: Nurse Practitioner

## 2023-07-08 VITALS — BP 116/80 | HR 60 | Temp 97.9°F | Ht 62.0 in | Wt 136.8 lb

## 2023-07-08 DIAGNOSIS — Z0000 Encounter for general adult medical examination without abnormal findings: Secondary | ICD-10-CM

## 2023-07-08 DIAGNOSIS — E663 Overweight: Secondary | ICD-10-CM

## 2023-07-08 DIAGNOSIS — R311 Benign essential microscopic hematuria: Secondary | ICD-10-CM

## 2023-07-08 DIAGNOSIS — Z0001 Encounter for general adult medical examination with abnormal findings: Secondary | ICD-10-CM

## 2023-07-08 DIAGNOSIS — R7309 Other abnormal glucose: Secondary | ICD-10-CM

## 2023-07-08 DIAGNOSIS — Z130 Encounter for screening for diseases of the blood and blood-forming organs and certain disorders involving the immune mechanism: Secondary | ICD-10-CM

## 2023-07-08 DIAGNOSIS — D251 Intramural leiomyoma of uterus: Secondary | ICD-10-CM

## 2023-07-08 DIAGNOSIS — E559 Vitamin D deficiency, unspecified: Secondary | ICD-10-CM

## 2023-07-08 DIAGNOSIS — Z131 Encounter for screening for diabetes mellitus: Secondary | ICD-10-CM | POA: Diagnosis not present

## 2023-07-08 DIAGNOSIS — Z1329 Encounter for screening for other suspected endocrine disorder: Secondary | ICD-10-CM | POA: Diagnosis not present

## 2023-07-08 DIAGNOSIS — Z136 Encounter for screening for cardiovascular disorders: Secondary | ICD-10-CM

## 2023-07-08 DIAGNOSIS — Z13220 Encounter for screening for lipoid disorders: Secondary | ICD-10-CM | POA: Diagnosis not present

## 2023-07-08 DIAGNOSIS — Z79899 Other long term (current) drug therapy: Secondary | ICD-10-CM | POA: Diagnosis not present

## 2023-07-08 DIAGNOSIS — Z23 Encounter for immunization: Secondary | ICD-10-CM

## 2023-07-08 DIAGNOSIS — I10 Essential (primary) hypertension: Secondary | ICD-10-CM

## 2023-07-08 NOTE — Progress Notes (Signed)
COMPLETE PHYSICAL  Assessment and Plan:  Nancy Carter was seen today for annual exam   Diagnoses and all orders for this visit:  Encounter for Annual Physical Exam with abnormal findings Due annually  Health Maintenance reviewed Healthy lifestyle reviewed and goals set  Essential hypertension Discussed DASH (Dietary Approaches to Stop Hypertension) DASH diet is lower in sodium than a typical American diet. Cut back on foods that are high in saturated fat, cholesterol, and trans fats. Eat more whole-grain foods, fish, poultry, and nuts Remain active and exercise as tolerated daily.  Monitor BP at home-Call if greater than 130/80.  Check CMP/CBC  Intramural leiomyoma of uterus Continue f/u with GYN as recommended   Benign microscopic hematuria Has seen urology without further workup recommended Low risk hx Continue to monitor  UA, microalbumin  Screening cholesterol level Check and monitor lipids  Screening for diabetes mellitus/ hx of prediabetes Check and monitor A1c  Screening for thyroid disorder Check and monitor TSH  Vitamin D deficiency Continue supplement Check and monitor levels  BMI 27 Discussed appropriate BMI Diet modification. Physical activity. Encouraged/praised to build confidence.  Screening for cardiovascular disease Monitor EKG  Medication management All medications discussed and reviewed in full. All questions and concerns regarding medications addressed.    Screening for deficiency anemia Monitor B12  Need for Td vaccine BUE hands and wrists with multiple cuts Due for Tetanus - administered without complication  Orders Placed This Encounter  Procedures   Td : Tetanus/diphtheria >7yo Preservative  free   CBC with Differential/Platelet   COMPLETE METABOLIC PANEL WITH GFR   Magnesium   Lipid panel   TSH   Hemoglobin A1c   Insulin, random   VITAMIN D 25 Hydroxy (Vit-D Deficiency, Fractures)   Urinalysis, Routine w reflex microscopic    Microalbumin / creatinine urine ratio   Vitamin B12   EKG 12-Lead   Notify office for further evaluation and treatment, questions or concerns if any reported s/s fail to improve.   The patient was advised to call back or seek an in-person evaluation if any symptoms worsen or if the condition fails to improve as anticipated.   Further disposition pending results of labs. Discussed med's effects and SE's.    I discussed the assessment and treatment plan with the patient. The patient was provided an opportunity to ask questions and all were answered. The patient agreed with the plan and demonstrated an understanding of the instructions.  Discussed med's effects and SE's. Screening labs and tests as requested with regular follow-up as recommended.  I provided 35 minutes of face-to-face time during this encounter including counseling, chart review, and critical decision making was preformed.   Future Appointments  Date Time Provider Department Center  06/08/2024  2:55 PM Jerene Bears, MD [facility]  07/10/2024  3:00 PM Adela Glimpse, NP [facility] None    HPI  62 y.o. Caucasian female, presents for a complete physical and follow up for has Intramural leiomyoma of uterus; Hypertension; H/O dysplastic nevus; Bradycardia; Overweight (BMI 25.0-29.9); Asymptomatic microscopic hematuria; Hyperlipidemia; Vitamin D deficiency; Other abnormal glucose (hx of prediabetes); and Postmenopausal on their problem list.   Married without kids, working as a Firefighter at [employer], enjoys her job.  She has no concerns today - overall reports feeling well.  She has been working outdoors and noticed increase in BUE cuts and scraps along her hands and wrist.  She is due to Td vaccine.    She has been followed  by  Dr. Valentina Shaggy GYN for intramural leiomyoma of uterus and benign ovarian neoplasm since 2014 and recently in 2019 had bilateral salpingectomy and R oopherectomy.   Pap smear neg with  neg HR HPV 2022. Plan to repeat 2025.  Mammogram 04/2023. Colonoscopy 08/22/2023.  Pt aware due next year.  Bone mineral density 2021.  Plan to repeat in 1-2 years via GYN.   She sees dermatology Dr. Hal Hope, NP annually for history of dysplastic nevus.   She has had ongoing asymptomatic microscopic hematuria for which she saw urology and was not recommended workup.   BMI is Body mass index is 25.02 kg/m., she is continuing to work on diet and exercise. Wt Readings from Last 3 Encounters:  07/08/23 136 lb 12.8 oz (62.1 kg)  06/07/23 136 lb (61.7 kg)  02/26/23 142 lb (64.4 kg)   Her blood pressure has been controlled at home, today their BP is BP: 116/80 She does workout. She denies chest pain, shortness of breath, dizziness.   She is not on cholesterol medication and denies myalgias. Her cholesterol is at goal. The cholesterol last visit was:   Lab Results  Component Value Date   CHOL 177 07/02/2022   HDL 61 07/02/2022   LDLCALC 95 07/02/2022   TRIG 115 07/02/2022   CHOLHDL 2.9 07/02/2022   She has hx of prediabetes (5.7% in 05/2015) reversed by lifestyle modification; last A1C in the office was:  Lab Results  Component Value Date   HGBA1C 5.7 (H) 07/02/2022   Last GFR: Lab Results  Component Value Date   GFRNONAA 82 01/01/2021   Patient in on vitamin D supplement, taking 1000 IU daily:  Lab Results  Component Value Date   VD25OH 18 (L) 07/02/2022     Current Medications:  Current Outpatient Medications on File Prior to Visit  Medication Sig Dispense Refill   Cholecalciferol 50 MCG (2000 UT) CAPS Take 1 capsule (2,000 Units total) by mouth daily.     losartan-hydrochlorothiazide (HYZAAR) 100-12.5 MG tablet Take 1 tablet by mouth daily. 90 tablet 1   No current facility-administered medications on file prior to visit.   Allergies:  No Known Allergies Medical History:  She has Intramural leiomyoma of uterus; Hypertension; H/O dysplastic nevus; Bradycardia;  Overweight (BMI 25.0-29.9); Asymptomatic microscopic hematuria; Hyperlipidemia; Vitamin D deficiency; Other abnormal glucose (hx of prediabetes); and Postmenopausal on their problem list. Health Maintenance:   Immunization History  Administered Date(s) Administered   Influenza Inj Mdck Quad Pf 06/05/2022   Influenza-Unspecified 05/17/2016, 04/22/2018, 05/17/2018, 05/11/2019, 06/12/2021, 06/15/2023   Td 05/14/2006, 07/08/2023   Tdap 06/08/2013   Tetanus: 2014 Pneumovax: N/A Prevnar 13: N/A Flu vaccine: 05/2022 Shingrix: Declines  Covid 19: had 2/2, 2021 pfizer -dates requested, 02/2022 had +Covid  Pap: 02/12/2021 normal with Dr. Hyacinth Meeker MGM: 2024 Negative f/u 1 year DEXA: 2021 - repeat via GYN 1-2 years  Colonoscopy: 08/2013 due in 10 years  EGD: N/A  Last Dental Exam: [facility], last exam 2024, q1m Last Eye Exam: Dr. [place], last 2024, wears glasses Last skin exam: Annually, Dr. Hal Hope NP, has scheduled 07/29/2021 Due 07/2023  Patient Care Team: Lucky Cowboy, MD as PCP - General (Internal Medicine) Chilton Si, MD as PCP - Cardiology (Cardiology) Pyrtle, Carie Caddy, MD as Consulting Physician (Gastroenterology) Jerene Bears, MD as Consulting Physician (Gynecology) Cherlyn Roberts, MD as Referring Physician (Dermatology)  Surgical History:  She has a past surgical history that includes Dilatation & currettage/hysteroscopy with resectoscope (N/A, 04/10/2013); Laparoscopic cholecystectomy (09-09-2001  dr Derrell Lolling  @[facility] ); and Laparoscopic bilateral salpingectomy (Right, 05/16/2018). Family History:  Herfamily history includes Arrhythmia in her paternal grandfather; Cerebral aneurysm (age of onset: 84) in her maternal grandmother; Diabetes in her maternal grandmother and mother; Hypertension in her brother, maternal grandfather, and mother; Obesity in her brother; Peripheral vascular disease in her brother; Stroke (age of onset: 30) in her paternal  grandmother. Social History:  She reports that she has never smoked. She has never used smokeless tobacco. She reports that she does not drink alcohol and does not use drugs.  Review of Systems: Review of Systems  Constitutional:  Negative for chills, diaphoresis, fever, malaise/fatigue and weight loss.  HENT:  Negative for hearing loss and tinnitus.   Eyes:  Negative for blurred vision and double vision.  Respiratory:  Negative for cough, shortness of breath and wheezing.   Cardiovascular:  Negative for chest pain, palpitations, orthopnea, claudication and leg swelling.  Gastrointestinal:  Negative for abdominal pain, blood in stool, constipation, diarrhea, heartburn, melena, nausea and vomiting.  Genitourinary: Negative.  Negative for dysuria and urgency.  Musculoskeletal:  Negative for joint pain and myalgias.  Skin:  Negative for rash.  Neurological:  Negative for dizziness, tingling, sensory change, weakness and headaches.  Endo/Heme/Allergies:  Negative for polydipsia.  Psychiatric/Behavioral: Negative.  Negative for depression, memory loss and substance abuse. The patient is not nervous/anxious and does not have insomnia.   All other systems reviewed and are negative.   Physical Exam: Estimated body mass index is 25.02 kg/m as calculated from the following:   Height as of this encounter: 5\' 2"  (1.575 m).   Weight as of this encounter: 136 lb 12.8 oz (62.1 kg). BP 116/80   Pulse 60   Temp 97.9 F (36.6 C)   Ht 5\' 2"  (1.575 m)   Wt 136 lb 12.8 oz (62.1 kg)   LMP 05/17/2016 (Approximate)   SpO2 98%   BMI 25.02 kg/m  General Appearance: Well nourished, in no apparent distress.  Eyes: PERRLA, EOMs, conjunctiva no swelling or erythema Sinuses: No Frontal/maxillary tenderness  ENT/Mouth: Ext aud canals clear, normal light reflex with TMs without erythema, bulging. Good dentition. No erythema, swelling, or exudate on post pharynx. Tonsils not swollen or erythematous. Hearing  normal.  Neck: Supple, thyroid normal. No bruits  Respiratory: Respiratory effort normal, BS equal bilaterally without rales, rhonchi, wheezing or stridor.  Cardio: RRR without murmurs, rubs or gallops. Brisk peripheral pulses without edema.  Chest: symmetric, with normal excursions and percussion.  Breasts: Defer to GYN Abdomen: Soft, nontender, no guarding, rebound, hernias, masses, or organomegaly.  Lymphatics: Non tender without lymphadenopathy.  Genitourinary: Defer to GYN Musculoskeletal: Full ROM all peripheral extremities,5/5 strength, and normal gait.  Skin: Warm, dry without rashes, lesions, ecchymosis. Neuro: Cranial nerves intact, reflexes equal bilaterally. Normal muscle tone, no cerebellar symptoms. Sensation intact.  Psych: Awake and oriented X 3, normal affect, Insight and Judgment appropriate.   EKG: no ST changes, sinus bradycardia   Adela Glimpse, NP 3:32 PM [facility]

## 2023-07-08 NOTE — Patient Instructions (Signed)
Healthy Eating, Adult Healthy eating may help you get and keep a healthy body weight, reduce the risk of chronic disease, and live a long and productive life. It is important to follow a healthy eating pattern. Your nutritional and calorie needs should be met mainly by different nutrient-rich foods. What are tips for following this plan? Reading food labels Read labels and choose the following: Reduced or low sodium products. Juices with 100% fruit juice. Foods with low saturated fats (<3 g per serving) and high polyunsaturated and monounsaturated fats. Foods with whole grains, such as whole wheat, cracked wheat, brown rice, and wild rice. Whole grains that are fortified with folic acid. This is recommended for females who are pregnant or who want to become pregnant. Read labels and do not eat or drink the following: Foods or drinks with added sugars. These include foods that contain brown sugar, corn sweetener, corn syrup, dextrose, fructose, glucose, high-fructose corn syrup, honey, invert sugar, lactose, malt syrup, maltose, molasses, raw sugar, sucrose, trehalose, or turbinado sugar. Limit your intake of added sugars to less than 10% of your total daily calories. Do not eat more than the following amounts of added sugar per day: 6 teaspoons (25 g) for females. 9 teaspoons (38 g) for males. Foods that contain processed or refined starches and grains. Refined grain products, such as white flour, degermed cornmeal, white bread, and white rice. Shopping Choose nutrient-rich snacks, such as vegetables, whole fruits, and nuts. Avoid high-calorie and high-sugar snacks, such as potato chips, fruit snacks, and candy. Use oil-based dressings and spreads on foods instead of solid fats such as butter, margarine, sour cream, or cream cheese. Limit pre-made sauces, mixes, and "instant" products such as flavored rice, instant noodles, and ready-made pasta. Try more plant-protein sources, such as tofu,  tempeh, black beans, edamame, lentils, nuts, and seeds. Explore eating plans such as the Mediterranean diet or vegetarian diet. Try heart-healthy dips made with beans and healthy fats like hummus and guacamole. Vegetables go great with these. Cooking Use oil to saut or stir-fry foods instead of solid fats such as butter, margarine, or lard. Try baking, boiling, grilling, or broiling instead of frying. Remove the fatty part of meats before cooking. Steam vegetables in water or broth. Meal planning  At meals, imagine dividing your plate into fourths: One-half of your plate is fruits and vegetables. One-fourth of your plate is whole grains. One-fourth of your plate is protein, especially lean meats, poultry, eggs, tofu, beans, or nuts. Include low-fat dairy as part of your daily diet. Lifestyle Choose healthy options in all settings, including home, work, school, restaurants, or stores. Prepare your food safely: Wash your hands after handling raw meats. Where you prepare food, keep surfaces clean by regularly washing with hot, soapy water. Keep raw meats separate from ready-to-eat foods, such as fruits and vegetables. Cook seafood, meat, poultry, and eggs to the recommended temperature. Get a food thermometer. Store foods at safe temperatures. In general: Keep cold foods at 50F (4.4C) or below. Keep hot foods at 150F (60C) or above. Keep your freezer at [place] (-17.8C) or below. Foods are not safe to eat if they have been between the temperatures of 40-150F (4.4-60C) for more than 2 hours. What foods should I eat? Fruits Aim to eat 1-2 cups of fresh, canned (in natural juice), or frozen fruits each day. One cup of fruit equals 1 small apple, 1 large banana, 8 large strawberries, 1 cup (237 g) canned fruit,  cup (82 g) dried fruit,  or 1 cup (240 mL) 100% juice. Vegetables Aim to eat 2-4 cups of fresh and frozen vegetables each day, including different varieties and colors. One cup  of vegetables equals 1 cup (91 g) broccoli or cauliflower florets, 2 medium carrots, 2 cups (150 g) raw, leafy greens, 1 large tomato, 1 large bell pepper, 1 large sweet potato, or 1 medium white potato. Grains Aim to eat 5-10 ounce-equivalents of whole grains each day. Examples of 1 ounce-equivalent of grains include 1 slice of bread, 1 cup (40 g) ready-to-eat cereal, 3 cups (24 g) popcorn, or  cup (93 g) cooked rice. Meats and other proteins Try to eat 5-7 ounce-equivalents of protein each day. Examples of 1 ounce-equivalent of protein include 1 egg,  oz nuts (12 almonds, 24 pistachios, or 7 walnut halves), 1/4 cup (90 g) cooked beans, 6 tablespoons (90 g) hummus or 1 tablespoon (16 g) peanut butter. A cut of meat or fish that is the size of a deck of cards is about 3-4 ounce-equivalents (85 g). Of the protein you eat each week, try to have at least 8 sounce (227 g) of seafood. This is about 2 servings per week. This includes salmon, trout, herring, sardines, and anchovies. Dairy Aim to eat 3 cup-equivalents of fat-free or low-fat dairy each day. Examples of 1 cup-equivalent of dairy include 1 cup (240 mL) milk, 8 ounces (250 g) yogurt, 1 ounces (44 g) natural cheese, or 1 cup (240 mL) fortified soy milk. Fats and oils Aim for about 5 teaspoons (21 g) of fats and oils per day. Choose monounsaturated fats, such as canola and olive oils, mayonnaise made with olive oil or avocado oil, avocados, peanut butter, and most nuts, or polyunsaturated fats, such as sunflower, corn, and soybean oils, walnuts, pine nuts, sesame seeds, sunflower seeds, and flaxseed. Beverages Aim for 6 eight-ounce glasses of water per day. Limit coffee to 3-5 eight-ounce cups per day. Limit caffeinated beverages that have added calories, such as soda and energy drinks. If you drink alcohol: Limit how much you have to: 0-1 drink a day if you are female. 0-2 drinks a day if you are female. Know how much alcohol is in your drink.  In the [place] one drink is one 12 oz bottle of beer (355 mL), one 5 oz glass of wine (148 mL), or one 1 oz glass of hard liquor (44 mL). Seasoning and other foods Try not to add too much salt to your food. Try using herbs and spices instead of salt. Try not to add sugar to food. This information is based on [place] nutrition guidelines. To learn more, visit DisposableNylon.be. Exact amounts may vary. You may need different amounts. This information is not intended to replace advice given to you by your health care provider. Make sure you discuss any questions you have with your health care provider. Document Revised: 05/04/2022 Document Reviewed: 05/04/2022 Elsevier Patient Education  2024 Elsevier Inc.   Td (Tetanus, Diphtheria) Vaccine: What You Need to Know Many vaccine information statements are available in Spanish and other languages. See PromoAge.com.br. 1. Why get vaccinated? Td vaccine can prevent tetanus and diphtheria. Tetanus enters the body through cuts or wounds. Diphtheria spreads from person to person. TETANUS (T) causes painful stiffening of the muscles. Tetanus can lead to serious health problems, including being unable to open the mouth, having trouble swallowing and breathing, or death. DIPHTHERIA (D) can lead to difficulty breathing, heart failure, paralysis, or death. 2. Td vaccine Td is only for  children 7 years and older, adolescents, and adults.  Td is usually given as a booster dose every 10 years, or after 5 years in the case of a severe or dirty wound or burn. Another vaccine, called "Tdap," may be used instead of Td. Tdap protects against pertussis, also known as "whooping cough," in addition to tetanus and diphtheria. Td may be given at the same time as other vaccines. 3. Talk with your health care provider Tell your vaccination provider if the person getting the vaccine: Has had an allergic reaction after a previous dose of any vaccine that protects against  tetanus or diphtheria, or has any severe, life-threatening allergies Has ever had Guillain-Barr Syndrome (also called "GBS") Has had severe pain or swelling after a previous dose of any vaccine that protects against tetanus or diphtheria In some cases, your health care provider may decide to postpone Td vaccination until a future visit. People with minor illnesses, such as a cold, may be vaccinated. People who are moderately or severely ill should usually wait until they recover before getting Td vaccine.  Your health care provider can give you more information. 4. Risks of a vaccine reaction Pain, redness, or swelling where the shot was given, mild fever, headache, feeling tired, and nausea, vomiting, diarrhea, or stomachache sometimes happen after Td vaccination. People sometimes faint after medical procedures, including vaccination. Tell your provider if you feel dizzy or have vision changes or ringing in the ears.  As with any medicine, there is a very remote chance of a vaccine causing a severe allergic reaction, other serious injury, or death. 5. What if there is a serious problem? An allergic reaction could occur after the vaccinated person leaves the clinic. If you see signs of a severe allergic reaction (hives, swelling of the face and throat, difficulty breathing, a fast heartbeat, dizziness, or weakness), call 9-1-1 and get the person to the nearest hospital.  For other signs that concern you, call your health care provider.  Adverse reactions should be reported to the Vaccine Adverse Event Reporting System (VAERS). Your health care provider will usually file this report, or you can do it yourself. Visit the VAERS website at www.vaers.LAgents.no or call 562-419-0253. VAERS is only for reporting reactions, and VAERS staff members do not give medical advice. 6. The National Vaccine Injury Compensation Program The [employer] Vaccine Injury Compensation Program (VICP) is a federal program that  was created to compensate people who may have been injured by certain vaccines. Claims regarding alleged injury or death due to vaccination have a time limit for filing, which may be as short as two years. Visit the VICP website at SpiritualWord.at or call 740-502-0014 to learn about the program and about filing a claim. 7. How can I learn more? Ask your health care provider. Call your local or state health department. Visit the website of the Food and Drug Administration (FDA) for vaccine package inserts and additional information at FinderList.no. Contact the Centers for Disease Control and Prevention (CDC): Call 276 419 1231 (1-800-CDC-INFO) or Visit CDC's website at PicCapture.uy. Source: CDC Vaccine Information Statement Td (Tetanus, Diphtheria) Vaccine (03/22/2020) This same material is available at FootballExhibition.com.br for no charge. This information is not intended to replace advice given to you by your health care provider. Make sure you discuss any questions you have with your health care provider. Document Revised: 11/18/2022 Document Reviewed: 09/18/2022 Elsevier Patient Education  2024 [employer].

## 2023-07-09 LAB — MAGNESIUM: Magnesium: 1.9 mg/dL (ref 1.5–2.5)

## 2023-07-09 LAB — CBC WITH DIFFERENTIAL/PLATELET
Absolute Lymphocytes: 2187 {cells}/uL (ref 850–3900)
Absolute Monocytes: 400 {cells}/uL (ref 200–950)
Basophils Absolute: 90 {cells}/uL (ref 0–200)
Basophils Relative: 1.3 %
Eosinophils Absolute: 69 {cells}/uL (ref 15–500)
Eosinophils Relative: 1 %
HCT: 37 % (ref 35.0–45.0)
Hemoglobin: 12.2 g/dL (ref 11.7–15.5)
MCH: 30.3 pg (ref 27.0–33.0)
MCHC: 33 g/dL (ref 32.0–36.0)
MCV: 91.8 fL (ref 80.0–100.0)
MPV: 11.4 fL (ref 7.5–12.5)
Monocytes Relative: 5.8 %
Neutro Abs: 4154 {cells}/uL (ref 1500–7800)
Neutrophils Relative %: 60.2 %
Platelets: 261 10*3/uL (ref 140–400)
RBC: 4.03 10*6/uL (ref 3.80–5.10)
RDW: 11.7 % (ref 11.0–15.0)
Total Lymphocyte: 31.7 %
WBC: 6.9 10*3/uL (ref 3.8–10.8)

## 2023-07-09 LAB — URINALYSIS, ROUTINE W REFLEX MICROSCOPIC
Bacteria, UA: NONE SEEN /[HPF]
Bilirubin Urine: NEGATIVE
Glucose, UA: NEGATIVE
Hyaline Cast: NONE SEEN /[LPF]
Ketones, ur: NEGATIVE
Leukocytes,Ua: NEGATIVE
Nitrite: NEGATIVE
Protein, ur: NEGATIVE
RBC / HPF: NONE SEEN /[HPF] (ref 0–2)
Specific Gravity, Urine: 1.009 (ref 1.001–1.035)
Squamous Epithelial / HPF: NONE SEEN /[HPF] (ref <=5)
WBC, UA: NONE SEEN /[HPF] (ref 0–5)
pH: 6 (ref 5.0–8.0)

## 2023-07-09 LAB — COMPLETE METABOLIC PANEL WITH GFR
AG Ratio: 1.7 (calc) (ref 1.0–2.5)
ALT: 9 U/L (ref 6–29)
AST: 18 U/L (ref 10–35)
Albumin: 4.2 g/dL (ref 3.6–5.1)
Alkaline phosphatase (APISO): 76 U/L (ref 37–153)
BUN: 12 mg/dL (ref 7–25)
CO2: 29 mmol/L (ref 20–32)
Calcium: 9.4 mg/dL (ref 8.6–10.4)
Chloride: 100 mmol/L (ref 98–110)
Creat: 0.93 mg/dL (ref 0.50–1.05)
Globulin: 2.5 g/dL (ref 1.9–3.7)
Glucose, Bld: 82 mg/dL (ref 65–99)
Potassium: 3.8 mmol/L (ref 3.5–5.3)
Sodium: 137 mmol/L (ref 135–146)
Total Bilirubin: 0.5 mg/dL (ref 0.2–1.2)
Total Protein: 6.7 g/dL (ref 6.1–8.1)
eGFR: 70 mL/min/{1.73_m2} (ref >=60)

## 2023-07-09 LAB — HEMOGLOBIN A1C
Hgb A1c MFr Bld: 5.6 %{Hb} (ref <5.7)
Mean Plasma Glucose: 114 mg/dL
eAG (mmol/L): 6.3 mmol/L

## 2023-07-09 LAB — LIPID PANEL
Cholesterol: 168 mg/dL (ref <200)
HDL: 60 mg/dL (ref >=50)
LDL Cholesterol (Calc): 88 mg/dL
Non-HDL Cholesterol (Calc): 108 mg/dL (ref <130)
Total CHOL/HDL Ratio: 2.8 (calc) (ref <5.0)
Triglycerides: 104 mg/dL (ref <150)

## 2023-07-09 LAB — VITAMIN D 25 HYDROXY (VIT D DEFICIENCY, FRACTURES): Vit D, 25-Hydroxy: 32 ng/mL (ref 30–100)

## 2023-07-09 LAB — VITAMIN B12: Vitamin B-12: 612 pg/mL (ref 200–1100)

## 2023-07-09 LAB — INSULIN, RANDOM: Insulin: 6.2 u[IU]/mL

## 2023-07-09 LAB — MICROALBUMIN / CREATININE URINE RATIO
Creatinine, Urine: 42 mg/dL (ref 20–275)
Microalb Creat Ratio: 10 mg/g{creat} (ref <30)
Microalb, Ur: 0.4 mg/dL

## 2023-07-09 LAB — MICROSCOPIC MESSAGE

## 2023-07-09 LAB — TSH: TSH: 1.07 m[IU]/L (ref 0.40–4.50)

## 2023-07-26 ENCOUNTER — Other Ambulatory Visit (HOSPITAL_BASED_OUTPATIENT_CLINIC_OR_DEPARTMENT_OTHER): Payer: Self-pay | Admitting: Cardiovascular Disease

## 2023-07-27 ENCOUNTER — Other Ambulatory Visit (HOSPITAL_COMMUNITY): Payer: Self-pay

## 2023-07-27 MED ORDER — LOSARTAN POTASSIUM-HCTZ 100-12.5 MG PO TABS
1.0000 | ORAL_TABLET | Freq: Every day | ORAL | 2 refills | Status: DC
  Filled 2023-07-27: qty 90, 90d supply, fill #0
  Filled 2023-11-15: qty 90, 90d supply, fill #1
  Filled 2024-02-12: qty 90, 90d supply, fill #2

## 2023-08-13 IMAGING — MG MM DIGITAL SCREENING BILAT W/ TOMO AND CAD
8 series · 9 of 24 positions shown · non-contrast
Comparison: Previous exam(s).

CLINICAL DATA: Screening.

EXAM:
DIGITAL SCREENING BILATERAL MAMMOGRAM WITH TOMOSYNTHESIS AND CAD
TECHNIQUE: Bilateral screening digital craniocaudal and mediolateral oblique
mammograms were obtained. Bilateral screening digital breast
tomosynthesis was performed. The images were evaluated with
computer-aided detection.

[L CC synth-2D]
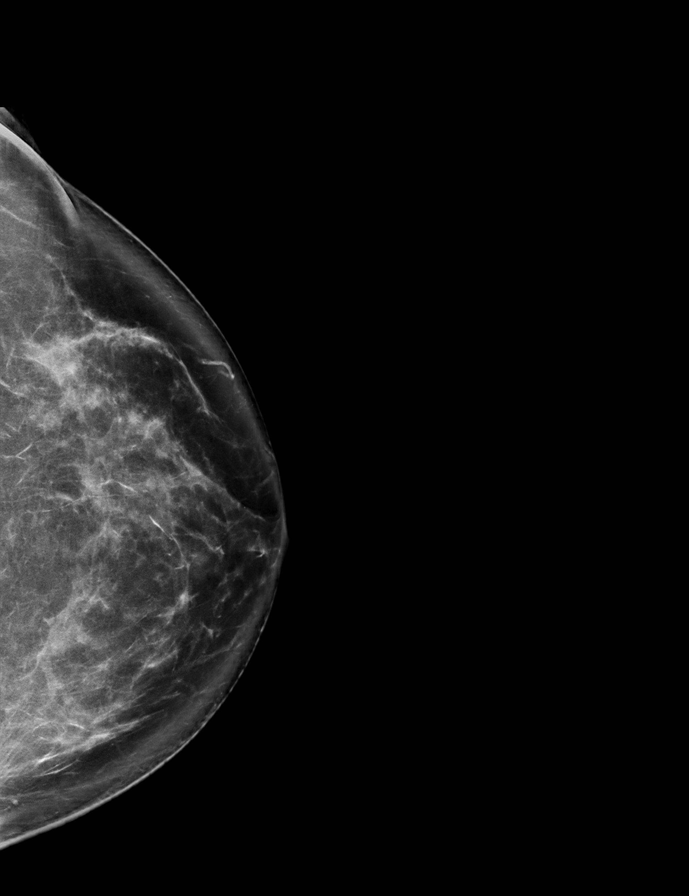

[L MLO synth-2D]
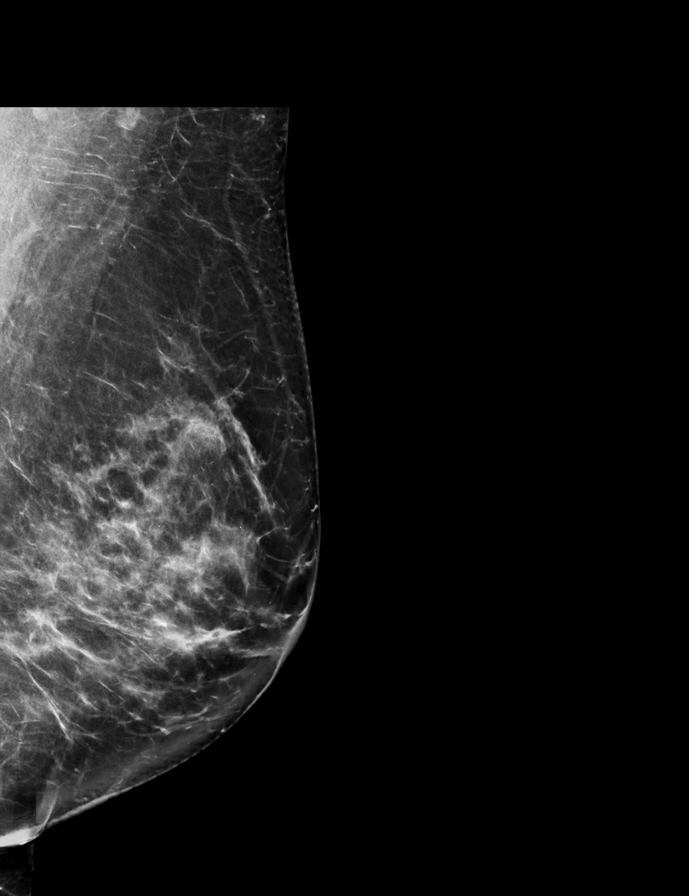

[R MLO synth-2D]
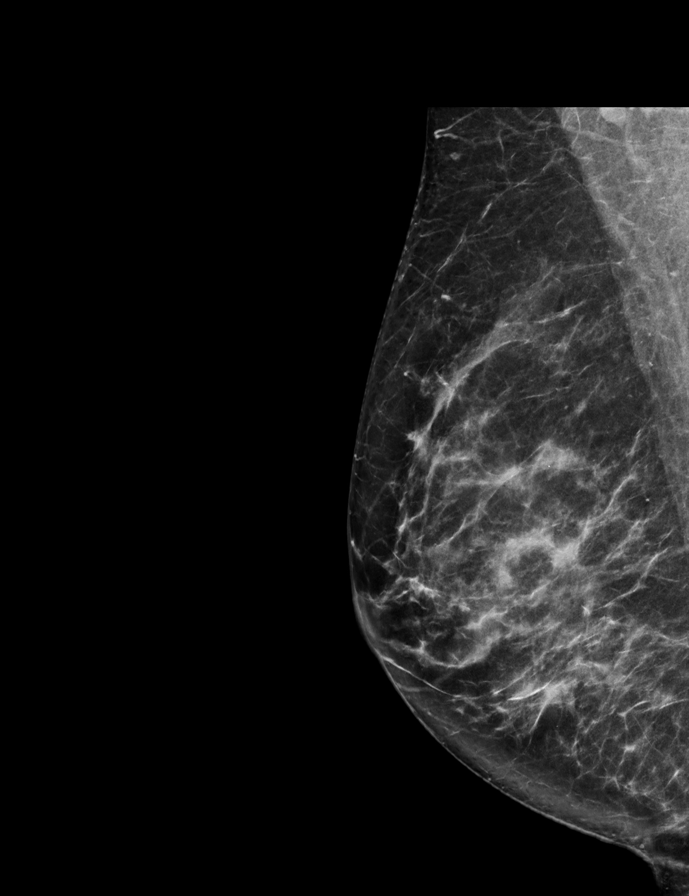

[R CC synth-2D]
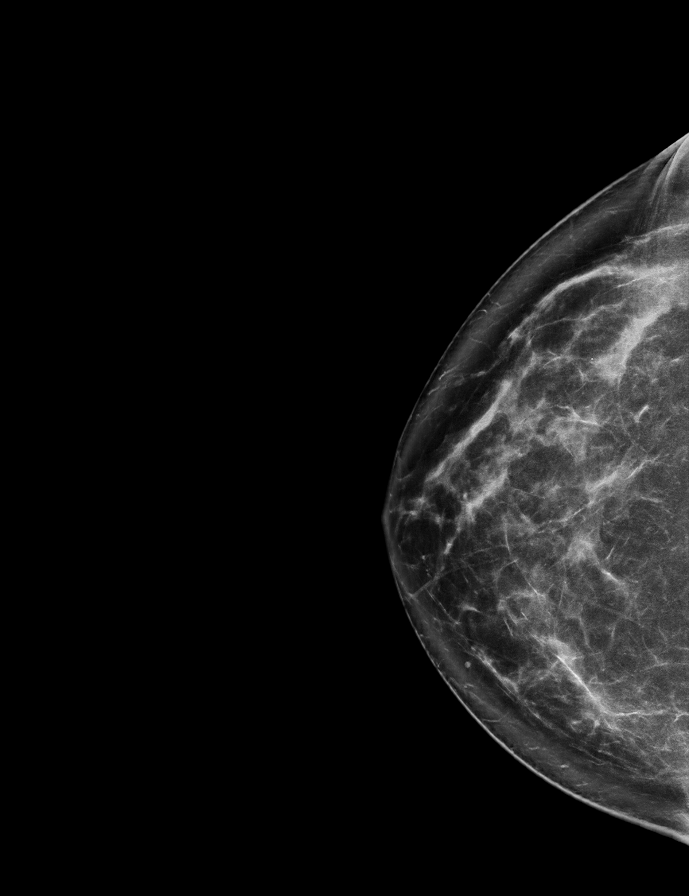

[R CC tomo · 2 of 85 frames shown]
[frame 28/85]
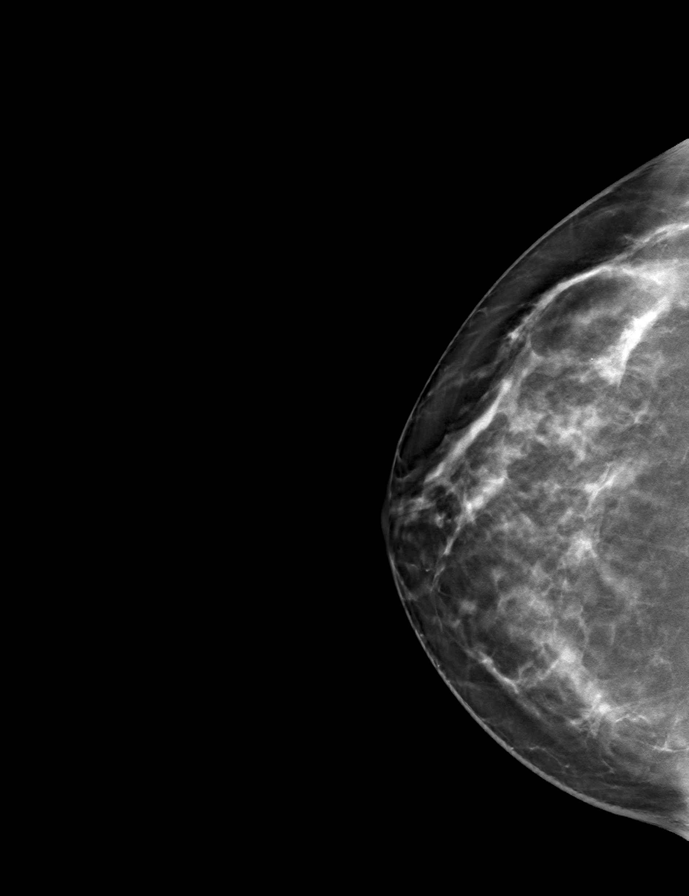
[frame 43/85]
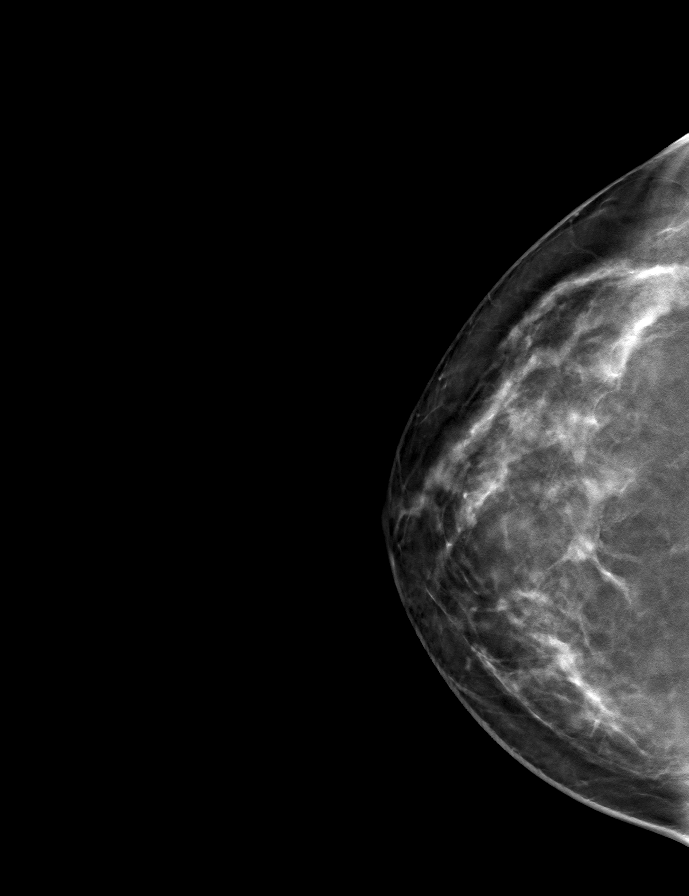

[L MLO tomo · tomo slice 39/78.0]
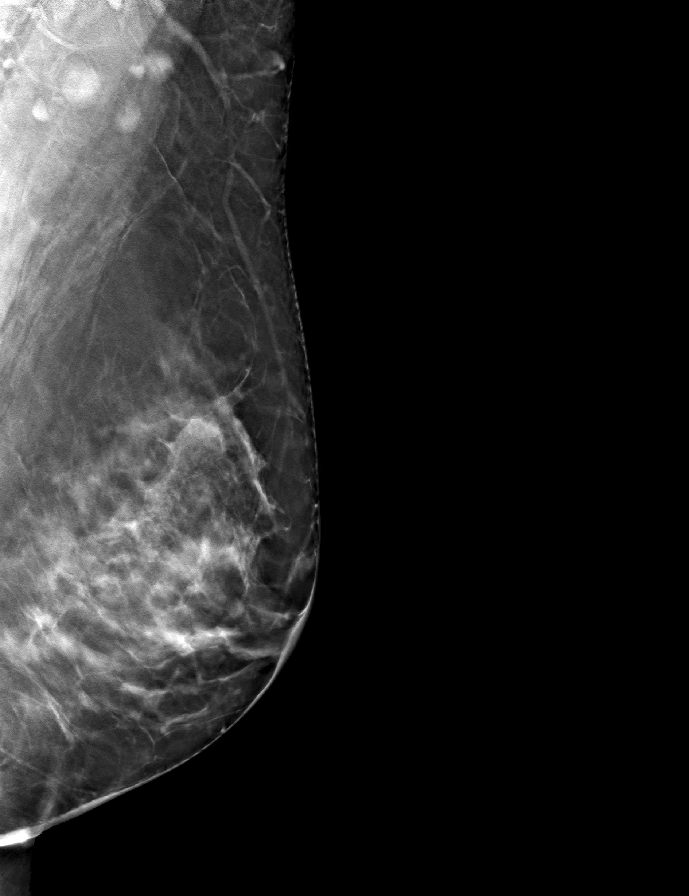

[R MLO tomo · tomo slice 37/72.0]
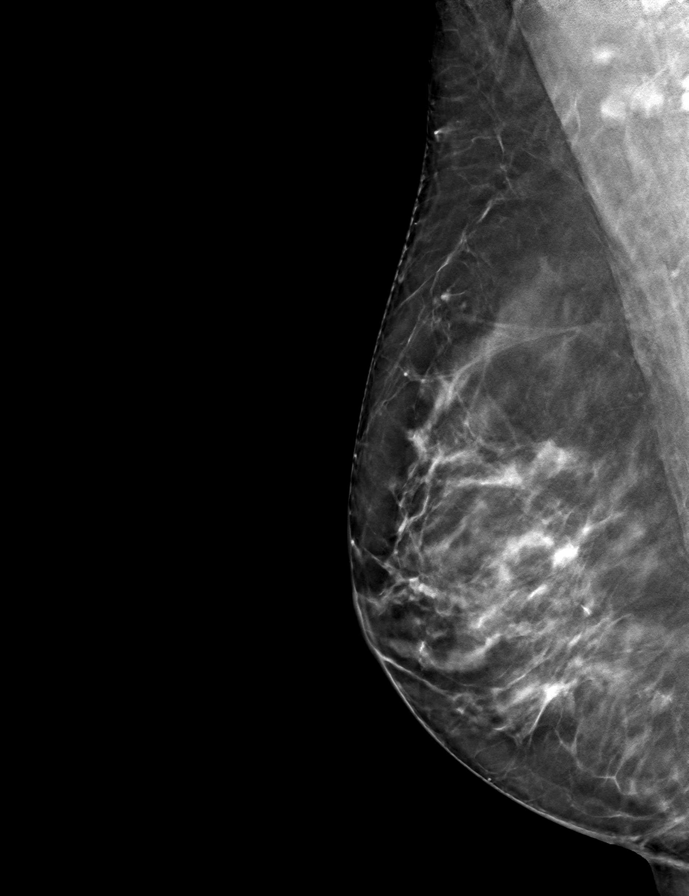

[L CC tomo · tomo slice 44/87.0]
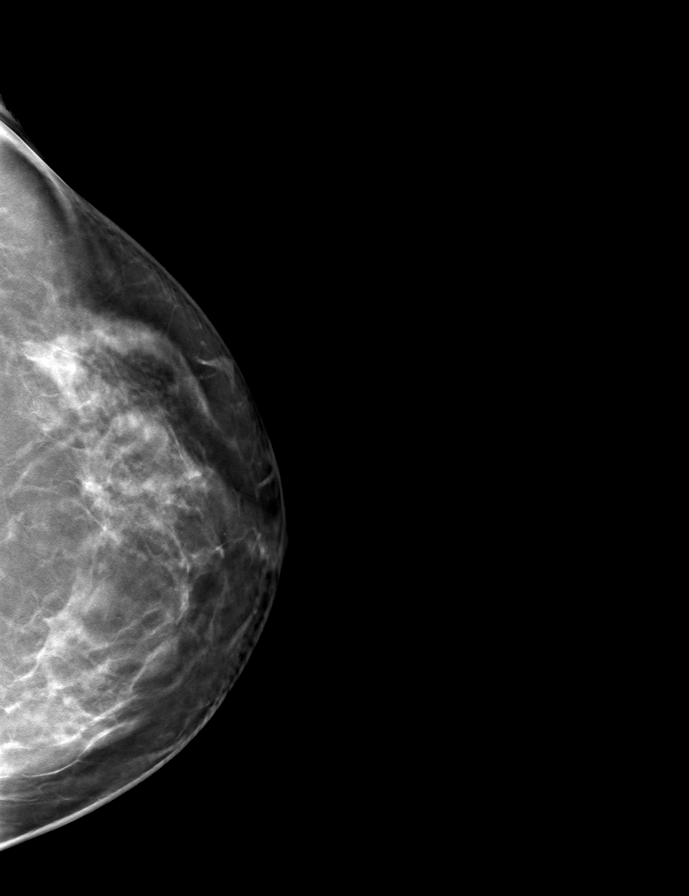

[9 of 24 positions shown; findings below may reference images not displayed]

ACR Breast Density Category c: The breast tissue is heterogeneously
dense, which may obscure small masses.
FINDINGS: There are no findings suspicious for malignancy.
IMPRESSION: No mammographic evidence of malignancy. A result letter of this
screening mammogram will be mailed directly to the patient.

RECOMMENDATION:
Screening mammogram in one year. (Code:Q3-W-BC3)

BI-RADS CATEGORY  1: Negative.

## 2023-09-09 ENCOUNTER — Other Ambulatory Visit (HOSPITAL_COMMUNITY): Payer: Self-pay

## 2023-09-09 DIAGNOSIS — L218 Other seborrheic dermatitis: Secondary | ICD-10-CM | POA: Diagnosis not present

## 2023-09-09 DIAGNOSIS — L821 Other seborrheic keratosis: Secondary | ICD-10-CM | POA: Diagnosis not present

## 2023-09-09 DIAGNOSIS — L814 Other melanin hyperpigmentation: Secondary | ICD-10-CM | POA: Diagnosis not present

## 2023-09-09 DIAGNOSIS — L408 Other psoriasis: Secondary | ICD-10-CM | POA: Diagnosis not present

## 2023-09-09 DIAGNOSIS — B353 Tinea pedis: Secondary | ICD-10-CM | POA: Diagnosis not present

## 2023-09-09 DIAGNOSIS — D225 Melanocytic nevi of trunk: Secondary | ICD-10-CM | POA: Diagnosis not present

## 2023-09-09 DIAGNOSIS — L280 Lichen simplex chronicus: Secondary | ICD-10-CM | POA: Diagnosis not present

## 2023-09-09 DIAGNOSIS — R202 Paresthesia of skin: Secondary | ICD-10-CM | POA: Diagnosis not present

## 2023-09-09 MED ORDER — FLUTICASONE PROPIONATE 0.05 % EX CREA
TOPICAL_CREAM | Freq: Two times a day (BID) | CUTANEOUS | 3 refills | Status: AC
  Filled 2023-09-09: qty 30, 30d supply, fill #0

## 2023-09-09 MED ORDER — TRIAMCINOLONE ACETONIDE 0.1 % EX CREA
TOPICAL_CREAM | Freq: Two times a day (BID) | CUTANEOUS | 2 refills | Status: AC
  Filled 2023-09-09: qty 30, 30d supply, fill #0
  Filled 2024-02-12: qty 30, 30d supply, fill #1

## 2023-10-14 DIAGNOSIS — H35372 Puckering of macula, left eye: Secondary | ICD-10-CM | POA: Diagnosis not present

## 2024-04-27 ENCOUNTER — Other Ambulatory Visit: Payer: Self-pay | Admitting: Obstetrics & Gynecology

## 2024-04-27 DIAGNOSIS — Z1231 Encounter for screening mammogram for malignant neoplasm of breast: Secondary | ICD-10-CM

## 2024-05-09 ENCOUNTER — Encounter: Payer: Self-pay | Admitting: Internal Medicine

## 2024-05-12 ENCOUNTER — Ambulatory Visit
Admission: RE | Admit: 2024-05-12 | Discharge: 2024-05-12 | Disposition: A | Discharge status: Home / Self Care | Source: Ambulatory Visit

## 2024-05-12 DIAGNOSIS — Z1231 Encounter for screening mammogram for malignant neoplasm of breast: Secondary | ICD-10-CM

## 2024-06-01 ENCOUNTER — Other Ambulatory Visit (HOSPITAL_BASED_OUTPATIENT_CLINIC_OR_DEPARTMENT_OTHER): Payer: Self-pay | Admitting: Cardiovascular Disease

## 2024-06-01 ENCOUNTER — Other Ambulatory Visit (HOSPITAL_COMMUNITY): Payer: Self-pay

## 2024-06-05 ENCOUNTER — Other Ambulatory Visit: Payer: Self-pay

## 2024-06-05 ENCOUNTER — Other Ambulatory Visit (HOSPITAL_COMMUNITY): Payer: Self-pay

## 2024-06-05 MED ORDER — LOSARTAN POTASSIUM-HCTZ 100-12.5 MG PO TABS
1.0000 | ORAL_TABLET | Freq: Every day | ORAL | 0 refills | Status: DC
  Filled 2024-06-05: qty 30, 30d supply, fill #0

## 2024-06-08 ENCOUNTER — Encounter (HOSPITAL_BASED_OUTPATIENT_CLINIC_OR_DEPARTMENT_OTHER): Payer: Self-pay | Admitting: Obstetrics & Gynecology

## 2024-06-08 ENCOUNTER — Ambulatory Visit (HOSPITAL_BASED_OUTPATIENT_CLINIC_OR_DEPARTMENT_OTHER): Payer: 59 | Admitting: Obstetrics & Gynecology

## 2024-06-08 ENCOUNTER — Other Ambulatory Visit (HOSPITAL_COMMUNITY)
Admission: RE | Admit: 2024-06-08 | Discharge: 2024-06-08 | Disposition: A | Discharge status: Home / Self Care | Source: Ambulatory Visit | Attending: Obstetrics & Gynecology | Admitting: Obstetrics & Gynecology

## 2024-06-08 VITALS — BP 124/68 | HR 53 | Ht 62.0 in | Wt 136.6 lb

## 2024-06-08 DIAGNOSIS — Z01419 Encounter for gynecological examination (general) (routine) without abnormal findings: Secondary | ICD-10-CM | POA: Diagnosis not present

## 2024-06-08 DIAGNOSIS — E2839 Other primary ovarian failure: Secondary | ICD-10-CM

## 2024-06-08 DIAGNOSIS — D251 Intramural leiomyoma of uterus: Secondary | ICD-10-CM | POA: Diagnosis not present

## 2024-06-08 DIAGNOSIS — Z1211 Encounter for screening for malignant neoplasm of colon: Secondary | ICD-10-CM

## 2024-06-08 DIAGNOSIS — Z780 Asymptomatic menopausal state: Secondary | ICD-10-CM

## 2024-06-08 DIAGNOSIS — Z124 Encounter for screening for malignant neoplasm of cervix: Secondary | ICD-10-CM

## 2024-06-08 NOTE — Progress Notes (Signed)
 ANNUAL EXAM Patient name: Nancy Carter MRN [id number]  Date of birth: 1960/11/25 Chief Complaint:   Annual Exam  History of Present Illness:   Nancy Carter is a 63 y.o. G0P0000 Caucasian female being seen today for a routine annual exam.  Doing well.  Still working full time.  Denies vaginal bleeding.    Patient's last menstrual period was 05/17/2016 (approximate).   Last pap 02/12/2021. Results were: NILM w/ HRHPV negative. H/O abnormal pap: no Last mammogram: 05/12/2024. Results were: normal. Family h/o breast cancer: no Last colonoscopy: 08/21/2013. Follow up 10 years.       06/08/2024    3:09 PM 06/07/2023    4:42 PM 05/26/2022   10:37 AM 07/02/2021    3:18 PM 02/12/2021    3:41 PM  Depression screen PHQ 2/9  Decreased Interest 0 0 0 0 0  Down, Depressed, Hopeless 0 0 0 0 0  PHQ - 2 Score 0 0 0 0 0  Altered sleeping 2      Tired, decreased energy 0      Change in appetite 0      Feeling bad or failure about yourself  0      Trouble concentrating 0      Moving slowly or fidgety/restless 0      Suicidal thoughts 0      PHQ-9 Score 2      Difficult doing work/chores Not difficult at all            06/08/2024    3:09 PM  GAD 7 : Generalized Anxiety Score  Nervous, Anxious, on Edge 0  Control/stop worrying 0  Worry too much - different things 0  Trouble relaxing 0  Restless 0  Easily annoyed or irritable 0  Afraid - awful might happen 0  Total GAD 7 Score 0  Anxiety Difficulty Not difficult at all     Review of Systems:   Pertinent items are noted in HPI Denies any pelvic pain, urinary or bowel changes Pertinent History Reviewed:  Reviewed past medical,surgical, social and family history.  Reviewed problem list, medications and allergies. Physical Assessment:   Vitals:   06/08/24 1508  BP: 124/68  Pulse: (!) 53  Weight: 136 lb 9.6 oz (62 kg)  Height: 5' 2 (1.575 m)  Body mass index is 24.98 kg/m.        Physical Examination:   General  appearance - well appearing, and in no distress  Mental status - alert, oriented to person, place, and time  Psych:  She has a normal mood and affect  Skin - warm and dry, normal color, no suspicious lesions noted  Chest - effort normal, all lung fields clear to auscultation bilaterally  Heart - normal rate and regular rhythm  Neck:  midline trachea, no thyromegaly or nodules  Breasts - breasts appear normal, no suspicious masses, no skin or nipple changes or  axillary nodes  Abdomen - soft, nontender, nondistended, no masses or organomegaly  Pelvic - VULVA: normal appearing vulva with no masses, tenderness or lesions   VAGINA: normal appearing vagina with normal color and discharge, no lesions   CERVIX: normal appearing cervix without discharge or lesions, no CMT  Thin prep pap is obtained with HR HPV cotesting  UTERUS: uterus is felt to be normal size, shape, consistency and nontender   ADNEXA: No adnexal masses or tenderness noted.  Rectal - normal rectal, good sphincter tone, no masses felt.   Extremities:  No swelling  or varicosities noted  Chaperone present for exam  No results found for this or any previous visit (from the past 24 hours).  Assessment & Plan:  1. Well woman exam with routine gynecological exam (Primary) - Pap smear obtained today - Mammogram 05/12/2024 - Colonoscopy declined but cologuard ordered for pt. - Bone mineral density 2021.  Normal.  Plan to repeat next year.  Order placed.   - lab work done 06/2023 - vaccines reviewed/updated  2. Cervical cancer screening - Cytology - PAP( [facility])  3. Screen for colon cancer - Cologuard  4. Postmenopausal - no ton HRT  5. Intramural leiomyoma of uterus    Orders Placed This Encounter  Procedures   Cologuard    Meds: No orders of the defined types were placed in this encounter.   Follow-up: Return in about 1 year (around 06/08/2025).  Ronal GORMAN Pinal, MD 06/08/2024 3:32 PM

## 2024-06-08 NOTE — Patient Instructions (Signed)
 Call [phone] to schedule an appointment at [facility] for your bone density

## 2024-06-13 LAB — CYTOLOGY - PAP
Comment: NEGATIVE
Diagnosis: NEGATIVE
High risk HPV: NEGATIVE

## 2024-06-14 ENCOUNTER — Ambulatory Visit (HOSPITAL_BASED_OUTPATIENT_CLINIC_OR_DEPARTMENT_OTHER): Payer: Self-pay | Admitting: Obstetrics & Gynecology

## 2024-06-15 ENCOUNTER — Encounter (HOSPITAL_BASED_OUTPATIENT_CLINIC_OR_DEPARTMENT_OTHER): Payer: Self-pay

## 2024-06-16 ENCOUNTER — Other Ambulatory Visit (HOSPITAL_COMMUNITY): Payer: Self-pay

## 2024-06-16 ENCOUNTER — Encounter (HOSPITAL_BASED_OUTPATIENT_CLINIC_OR_DEPARTMENT_OTHER): Payer: Self-pay | Admitting: Family

## 2024-06-16 ENCOUNTER — Ambulatory Visit (HOSPITAL_BASED_OUTPATIENT_CLINIC_OR_DEPARTMENT_OTHER): Admitting: Family

## 2024-06-16 VITALS — BP 130/78 | HR 58 | Ht 62.0 in | Wt 136.9 lb

## 2024-06-16 DIAGNOSIS — I10 Essential (primary) hypertension: Secondary | ICD-10-CM | POA: Diagnosis not present

## 2024-06-16 DIAGNOSIS — R001 Bradycardia, unspecified: Secondary | ICD-10-CM | POA: Diagnosis not present

## 2024-06-16 MED ORDER — LOSARTAN POTASSIUM-HCTZ 100-12.5 MG PO TABS
1.0000 | ORAL_TABLET | Freq: Every day | ORAL | 3 refills | Status: AC
  Filled 2024-06-16 – 2024-07-01 (×2): qty 90, 90d supply, fill #0

## 2024-06-16 NOTE — Progress Notes (Signed)
  Cardiology Office Note:  .   Date:  06/16/2024  ID:  Nancy Carter, DOB [birth year], MRN [id number] PCP: Laurice President, NP  [facility] Providers Cardiologist:  Annabella Scarce, MD    History of Present Illness: .   Nancy Carter is a 63 y.o. female with history of hypertension, hyperlipidemia, bradycardia.  Established cardiologist will/2023 due to bradycardia.  Due to suboptimal BP control losartan /HCT was increased to 100-12.5 mg daily.  As she was not on AV nodal blocking agent and was asymptomatic in regards to bradycardia no further intervention was recommended.She was last seen 02/2023 doing well from a cardiac perspective and no changes made at that time.  Presents today for follow-up independently.  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 Doing [employer] classes as well as walking for exercise. BP at home at goal <130/80. Reports no shortness of breath nor dyspnea on exertion. Reports no chest pain, pressure, or tightness. No edema, orthopnea, PND. Reports no palpitations.    ROS: Please see the history of present illness.    All other systems reviewed and are negative.   Studies Reviewed: SABRA   EKG Interpretation Date/Time:  Friday June 16 2024 15:02:53 EDT Ventricular Rate:  58 PR Interval:  170 QRS Duration:  86 QT Interval:  422 QTC Calculation: 414 R Axis:   26  Text Interpretation: Sinus bradycardia When compared with ECG of 26-Feb-2023 11:05, No significant change was found Confirmed by Vannie Mora ([id number]) on 06/16/2024 3:03:26 PM        Risk Assessment/Calculations:             Physical Exam:   VS:  BP 130/78   Pulse (!) 58   Ht 5' 2 (1.575 m)   Wt 136 lb 14.4 oz (62.1 kg)   LMP 05/17/2016 (Approximate)   SpO2 92%   BMI 25.04 kg/m    Wt Readings from Last 3 Encounters:  06/16/24 136 lb 14.4 oz (62.1 kg)  06/08/24 136 lb 9.6 oz (62 kg)  07/08/23 136 lb 12.8 oz (62.1 kg)    GEN: Well nourished, well developed in no acute  distress NECK: No JVD; No carotid bruits CARDIAC: RRR, no murmurs, rubs, gallops RESPIRATORY:  Clear to auscultation without rales, wheezing or rhonchi  ABDOMEN: Soft, non-tender, non-distended EXTREMITIES:  No edema; No deformity   ASSESSMENT AND PLAN: .    HTN - BP well controlled. Continue current antihypertensive regimen losartan -hydrochlorothiazide  100-12.5 mg daily.  Refill provided. Labs upcoming with PCP. Recommend aiming for 150 minutes of moderate intensity activity per week and following a heart healthy diet.    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 This is longstanding. She is not on AV nodal blocking agent.  Asymptomatic with no lightheadedness, dizziness.  No indication for further intervention at this time.  If develops symptomatic hypotension would plan for ZIO monitor.       Dispo: Follow-up PRN with cardiology  Signed, Mora GORMAN Vannie, NP

## 2024-06-16 NOTE — Patient Instructions (Addendum)
 Medication Instructions:  Continue your current medications.  We have sent a refill of your Losartan -hydrochlorothiazide  to [facility]. Recommend further refills with primary care provider.   *If you need a refill on your cardiac medications before your next appointment, please call your pharmacy*  Follow-Up: At [facility], you and your health needs are our priority.  As part of our continuing mission to provide you with exceptional heart care, our providers are all part of one team.  This team includes your primary Cardiologist (physician) and Advanced Practice Providers or APPs (Physician Assistants and Nurse Practitioners) who all work together to provide you with the care you need, when you need it.  Your next appointment:   As needed with cardiology  We recommend signing up for the patient portal called MyChart.  Sign up information is provided on this After Visit Summary.  MyChart is used to connect with patients for Virtual Visits (Telemedicine).  Patients are able to view lab/test results, encounter notes, upcoming appointments, etc.  Non-urgent messages can be sent to your provider as well.   To learn more about what you can do with MyChart, go to [email or web address].   Other Instructions  Heart Healthy Diet Recommendations: A low-salt diet is recommended. Meats should be grilled, baked, or boiled. Avoid fried foods. Focus on lean protein sources like fish or chicken with vegetables and fruits. The American Heart Association is a Chief Technology Officer!  American Heart Association Diet and Lifeystyle Recommendations   Exercise recommendations: The American Heart Association recommends 150 minutes of moderate intensity exercise weekly. Try 30 minutes of moderate intensity exercise 4-5 times per week. This could include walking, jogging, or swimming.

## 2024-06-21 DIAGNOSIS — Z1211 Encounter for screening for malignant neoplasm of colon: Secondary | ICD-10-CM | POA: Diagnosis not present

## 2024-06-26 DIAGNOSIS — I10 Essential (primary) hypertension: Secondary | ICD-10-CM | POA: Diagnosis not present

## 2024-06-26 DIAGNOSIS — Z0000 Encounter for general adult medical examination without abnormal findings: Secondary | ICD-10-CM | POA: Diagnosis not present

## 2024-06-26 DIAGNOSIS — E785 Hyperlipidemia, unspecified: Secondary | ICD-10-CM | POA: Diagnosis not present

## 2024-06-26 LAB — COLOGUARD: COLOGUARD: NEGATIVE

## 2024-07-03 ENCOUNTER — Other Ambulatory Visit (HOSPITAL_COMMUNITY): Payer: Self-pay

## 2024-07-10 ENCOUNTER — Encounter: Payer: 59 | Admitting: Nurse Practitioner

## 2025-04-26 ENCOUNTER — Ambulatory Visit (HOSPITAL_BASED_OUTPATIENT_CLINIC_OR_DEPARTMENT_OTHER)
